# Patient Record
Sex: Female | Born: 1980 | Race: White | Hispanic: No | Marital: Married | Smoking: Never smoker
Health system: Southern US, Community
[De-identification: ages and names within clinical notes are randomized; demographics above are authoritative.]

## PROBLEM LIST (undated history)

## (undated) DIAGNOSIS — I10 Essential (primary) hypertension: Secondary | ICD-10-CM

## (undated) DIAGNOSIS — D649 Anemia, unspecified: Secondary | ICD-10-CM

## (undated) DIAGNOSIS — E785 Hyperlipidemia, unspecified: Secondary | ICD-10-CM

## (undated) DIAGNOSIS — J449 Chronic obstructive pulmonary disease, unspecified: Secondary | ICD-10-CM

## (undated) DIAGNOSIS — R87619 Unspecified abnormal cytological findings in specimens from cervix uteri: Secondary | ICD-10-CM

## (undated) DIAGNOSIS — F32A Depression, unspecified: Secondary | ICD-10-CM

## (undated) DIAGNOSIS — F419 Anxiety disorder, unspecified: Secondary | ICD-10-CM

## (undated) HISTORY — DX: Anemia, unspecified: D64.9

## (undated) HISTORY — PX: CRYOTHERAPY: SHX1416

## (undated) HISTORY — DX: Anxiety disorder, unspecified: F41.9

## (undated) HISTORY — DX: Chronic obstructive pulmonary disease, unspecified: J44.9

## (undated) HISTORY — DX: Essential (primary) hypertension: I10

## (undated) HISTORY — DX: Hyperlipidemia, unspecified: E78.5

## (undated) HISTORY — DX: Depression, unspecified: F32.A

## (undated) HISTORY — DX: Unspecified abnormal cytological findings in specimens from cervix uteri: R87.619

---

## 2002-02-20 HISTORY — PX: LAPAROSCOPY: SHX197

## 2004-08-09 ENCOUNTER — Other Ambulatory Visit: Admission: RE | Admit: 2004-08-09 | Discharge: 2004-08-09 | Payer: Self-pay | Admitting: Obstetrics & Gynecology

## 2007-05-14 ENCOUNTER — Encounter: Admission: RE | Admit: 2007-05-14 | Discharge: 2007-05-14 | Payer: Self-pay | Admitting: Obstetrics and Gynecology

## 2007-06-27 ENCOUNTER — Inpatient Hospital Stay (HOSPITAL_COMMUNITY): Admission: AD | Admit: 2007-06-27 | Discharge: 2007-06-27 | Payer: Self-pay | Admitting: Obstetrics & Gynecology

## 2007-06-27 ENCOUNTER — Inpatient Hospital Stay (HOSPITAL_COMMUNITY): Admission: AD | Admit: 2007-06-27 | Discharge: 2007-06-29 | Payer: Self-pay | Admitting: Obstetrics and Gynecology

## 2008-02-21 DIAGNOSIS — R87619 Unspecified abnormal cytological findings in specimens from cervix uteri: Secondary | ICD-10-CM

## 2008-02-21 HISTORY — DX: Unspecified abnormal cytological findings in specimens from cervix uteri: R87.619

## 2009-10-24 ENCOUNTER — Inpatient Hospital Stay (HOSPITAL_COMMUNITY): Admission: AD | Admit: 2009-10-24 | Discharge: 2009-10-25 | Payer: Self-pay | Admitting: Obstetrics & Gynecology

## 2010-01-27 ENCOUNTER — Inpatient Hospital Stay (HOSPITAL_COMMUNITY): Admission: AD | Admit: 2010-01-27 | Discharge: 2009-10-27 | Payer: Self-pay | Admitting: Obstetrics & Gynecology

## 2010-05-05 LAB — CBC
HCT: 29.2 % — ABNORMAL LOW (ref 36.0–46.0)
HCT: 40.8 % (ref 36.0–46.0)
Hemoglobin: 10.1 g/dL — ABNORMAL LOW (ref 12.0–15.0)
Hemoglobin: 14 g/dL (ref 12.0–15.0)
MCH: 32 pg (ref 26.0–34.0)
MCHC: 34.6 g/dL (ref 30.0–36.0)
MCV: 91.4 fL (ref 78.0–100.0)
MCV: 92.4 fL (ref 78.0–100.0)
RBC: 3.16 MIL/uL — ABNORMAL LOW (ref 3.87–5.11)
WBC: 11.5 10*3/uL — ABNORMAL HIGH (ref 4.0–10.5)

## 2010-05-05 LAB — DIFFERENTIAL
Eosinophils Relative: 0 % (ref 0–5)
Lymphocytes Relative: 13 % (ref 12–46)
Lymphs Abs: 1.5 10*3/uL (ref 0.7–4.0)
Monocytes Absolute: 1.2 10*3/uL — ABNORMAL HIGH (ref 0.1–1.0)
Monocytes Relative: 10 % (ref 3–12)
Neutro Abs: 8.7 10*3/uL — ABNORMAL HIGH (ref 1.7–7.7)

## 2010-07-05 NOTE — Discharge Summary (Signed)
Monica Fry, Monica Fry              ACCOUNT NO.:  [id number]   MEDICAL RECORD NO.:  [id number]          PATIENT TYPE:  INP   LOCATION:  9132                          FACILITY:  [facility]   PHYSICIAN:  Gerrit Friends. Aldona Bar, M.D.   DATE OF BIRTH:  September 03, 1980   DATE OF ADMISSION:  06/27/2007  DATE OF DISCHARGE:  06/29/2007                               DISCHARGE SUMMARY   DISCHARGE DIAGNOSES:  1. Term pregnancy, delivered 7-pound 11-ounce female infant, Apgars 9      and 9.  2. Blood type O positive.   SUMMARY:  This 30 year old primigravida was admitted at 38-39 weeks in  labor.  Her pregnancy was relatively benign.  She progressed and after  pushing for 2-1/2 hours with good effort, was assisted with vacuum and  delivered a viable female infant weighing 7 pounds 11 ounces with good  Apgars over a second-degree episiotomy.  Episiotomy was repaired without  difficulty and actually turned into a slight third-degree tear.  Postpartum course was benign.  Mother did have some hypertension and was  treated postpartum with p.o. labetalol, 200 mg twice daily, with good  normotensive values noted even at the time of discharge.  On the morning  of Jun 25, 2007, she had a hemoglobin of 11.9, a white count of 13,600,  and a platelet count of 106,000 (was 90,000 on Jun 28, 2007).  Her  comprehensive metabolic profile on Jun 25, 2007, was within normal limits  as well.   On the morning of Jun 25, 2007, she was given all appropriate  instructions and understood all instructions well.   DISCHARGE MEDICATIONS:  Include Advil and she will use 2-3 every 4-5  hours as needed for pain.  She will be continued on her vitamins.  She  will continue on labetalol 200 mg twice daily for another 10 days and  will be checked in the office in approximately 8-9 days, and a decision  will be made at that time on discontinuance of her labetalol.   She was given all the appropriate instructions for discharge brochure  and  understood all instructions well.   CONDITION ON DISCHARGE:  Improved.      Gerrit Friends. Aldona Bar, M.D.  Electronically Signed     RMW/MEDQ  D:  06/29/2007  T:  06/29/2007  Job:  [id number]

## 2010-12-06 ENCOUNTER — Encounter: Payer: Self-pay | Admitting: Obstetrics & Gynecology

## 2010-12-06 ENCOUNTER — Ambulatory Visit (INDEPENDENT_AMBULATORY_CARE_PROVIDER_SITE_OTHER): Payer: BC Managed Care – PPO | Admitting: Obstetrics & Gynecology

## 2010-12-06 VITALS — BP 117/75 | HR 94 | Temp 98.5°F | Resp 16 | Ht 62.0 in | Wt 132.0 lb

## 2010-12-06 DIAGNOSIS — Z01419 Encounter for gynecological examination (general) (routine) without abnormal findings: Secondary | ICD-10-CM

## 2010-12-06 DIAGNOSIS — Z23 Encounter for immunization: Secondary | ICD-10-CM

## 2010-12-06 DIAGNOSIS — Z1272 Encounter for screening for malignant neoplasm of vagina: Secondary | ICD-10-CM

## 2010-12-06 DIAGNOSIS — N949 Unspecified condition associated with female genital organs and menstrual cycle: Secondary | ICD-10-CM

## 2010-12-06 DIAGNOSIS — Z0000 Encounter for general adult medical examination without abnormal findings: Secondary | ICD-10-CM

## 2010-12-06 DIAGNOSIS — G8929 Other chronic pain: Secondary | ICD-10-CM

## 2010-12-06 DIAGNOSIS — Z113 Encounter for screening for infections with a predominantly sexual mode of transmission: Secondary | ICD-10-CM

## 2010-12-06 MED ORDER — NORETHINDRONE-ETH ESTRADIOL 1-35 MG-MCG PO TABS: ORAL_TABLET | ORAL | Status: DC

## 2010-12-06 NOTE — Progress Notes (Signed)
Subjective:    Patient ID: Monica Fry, female    DOB: 02/22/1980, 30 y.o.   MRN: [id number]  HPI    Review of Systems     Objective:   Physical Exam        Assessment & Plan:   Subjective:    Monica Fry is a 30 y.o. female who presents for an annual exam. The patient has no complaints today except that her chronic pelvic pain has returned, and she would like to restart her OCPs in continuous fashion. The patient is sexually active. GYN screening history: last pap: was abnormal: ?LGSIL. The patient wears seatbelts: yes. The patient participates in regular exercise: yes. Has the patient ever been transfused or tattooed?: yes.(tattoo) The patient reports that there is not domestic violence in her life.   Menstrual History: OB History    Grav Para Term Preterm Abortions TAB SAB Ect Mult Living   2 2 2       2       Menarche age: 9 Patient's last menstrual period was 11/30/2010.    The following portions of the patient's history were reviewed and updated as appropriate: allergies, current medications, past family history, past medical history, past social history, past surgical history and problem list.  Review of Systems A comprehensive review of systems was negative.  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   Objective:    BP 117/75  Pulse 94  Temp(Src) 98.5 F (36.9 C) (Oral)  Resp 16  Ht 5\' 2"  (1.575 m)  Wt 132 lb (59.875 kg)  BMI 24.14 kg/m2  LMP 11/30/2010  Breastfeeding? Unknown  General Appearance:    Alert, cooperative, no distress, appears stated age  Head:    Normocephalic, without obvious abnormality, atraumatic  Eyes:    PERRL, conjunctiva/corneas clear, EOM's intact, fundi    benign, both eyes  Ears:    Normal TM's and external ear canals, both ears  Nose:   Nares normal, septum midline, mucosa normal, no drainage    or sinus tenderness  Throat:   Lips, mucosa, and tongue normal; teeth and gums normal  Neck:   Supple, symmetrical,  trachea midline, no adenopathy;    thyroid:  no enlargement/tenderness/nodules; no carotid   bruit or JVD  Back:     Symmetric, no curvature, ROM normal, no CVA tenderness  Lungs:     Clear to auscultation bilaterally, respirations unlabored  Chest Wall:    No tenderness or deformity   Heart:    Regular rate and rhythm, S1 and S2 normal, no murmur, rub   or gallop  Breast Exam:    No tenderness, masses, or nipple abnormality  Abdomen:     Soft, non-tender, bowel sounds active all four quadrants,    no masses, no organomegaly  Genitalia:    Normal female without lesion, discharge or tenderness, cervix shows evidence of a LEEP, NSSRV, mobile ,NT non-enlarged and NT adnexa  Rectal:     Extremities:   Extremities normal, atraumatic, no cyanosis or edema  Pulses:   2+ and symmetric all extremities  Skin:   Skin color, texture, turgor normal, no rashes or lesions  Lymph nodes:   Cervical, supraclavicular, and axillary nodes normal  Neurologic:   CNII-XII intact, normal strength, sensation and reflexes    throughout  .    Assessment:    Healthy female exam.   GSUI Plan:     Await pap smear results.  Rec SBE/SVE Information of TVT given Flu  shot today

## 2011-11-29 ENCOUNTER — Other Ambulatory Visit: Payer: Self-pay | Admitting: Obstetrics & Gynecology

## 2011-12-01 ENCOUNTER — Other Ambulatory Visit: Payer: Self-pay | Admitting: *Deleted

## 2011-12-01 DIAGNOSIS — Z309 Encounter for contraceptive management, unspecified: Secondary | ICD-10-CM

## 2011-12-01 MED ORDER — NORETHINDRONE-ETH ESTRADIOL 1-35 MG-MCG PO TABS: ORAL_TABLET | ORAL | Status: DC

## 2011-12-01 NOTE — Telephone Encounter (Signed)
RF granted for OCP's as pt has appt in 2 weeks.

## 2011-12-13 ENCOUNTER — Encounter: Payer: Self-pay | Admitting: Obstetrics & Gynecology

## 2011-12-13 ENCOUNTER — Ambulatory Visit (INDEPENDENT_AMBULATORY_CARE_PROVIDER_SITE_OTHER): Payer: BC Managed Care – PPO | Admitting: Obstetrics & Gynecology

## 2011-12-13 VITALS — BP 124/81 | HR 102 | Temp 97.6°F | Resp 16 | Ht 62.0 in | Wt 134.0 lb

## 2011-12-13 DIAGNOSIS — Z23 Encounter for immunization: Secondary | ICD-10-CM

## 2011-12-13 DIAGNOSIS — Z01419 Encounter for gynecological examination (general) (routine) without abnormal findings: Secondary | ICD-10-CM

## 2011-12-13 DIAGNOSIS — Z124 Encounter for screening for malignant neoplasm of cervix: Secondary | ICD-10-CM

## 2011-12-13 DIAGNOSIS — Z1151 Encounter for screening for human papillomavirus (HPV): Secondary | ICD-10-CM

## 2011-12-13 DIAGNOSIS — Z309 Encounter for contraceptive management, unspecified: Secondary | ICD-10-CM

## 2011-12-13 MED ORDER — NORETHINDRONE-ETH ESTRADIOL 1-35 MG-MCG PO TABS: ORAL_TABLET | ORAL | Status: DC

## 2011-12-13 MED ORDER — INFLUENZA VIRUS VACC SPLIT PF IM SUSP: 0.5000 mL | Freq: Once | INTRAMUSCULAR | Status: DC

## 2011-12-13 NOTE — Progress Notes (Signed)
Subjective:    Monica Fry is a 31 y.o. female who presents for an annual exam. The patient has no complaints today. The patient is sexually active. GYN screening history: last pap: was normal. The patient wears seatbelts: yes. The patient participates in regular exercise: yes. Has the patient ever been transfused or tattooed?: yes. The patient reports that there is not domestic violence in her life.   Menstrual History: OB History    Grav Para Term Preterm Abortions TAB SAB Ect Mult Living   2 2 2       2       Menarche age: 43 Patient's last menstrual period was 11/29/2011.    The following portions of the patient's history were reviewed and updated as appropriate: allergies, current medications, past family history, past medical history, past social history, past surgical history and problem list.  Review of Systems A comprehensive review of systems was negative. 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   Objective:    BP 124/81  Pulse 102  Temp 97.6 F (36.4 C) (Oral)  Resp 16  Ht 5\' 2"  (1.575 m)  Wt 134 lb (60.782 kg)  BMI 24.51 kg/m2  LMP 11/29/2011  Breastfeeding? No  General Appearance:    Alert, cooperative, no distress, appears stated age  Head:    Normocephalic, without obvious abnormality, atraumatic  Eyes:    PERRL, conjunctiva/corneas clear, EOM's intact, fundi    benign, both eyes  Ears:    Normal TM's and external ear canals, both ears  Nose:   Nares normal, septum midline, mucosa normal, no drainage    or sinus tenderness  Throat:   Lips, mucosa, and tongue normal; teeth and gums normal  Neck:   Supple, symmetrical, trachea midline, no adenopathy;    thyroid:  no enlargement/tenderness/nodules; no carotid   bruit or JVD  Back:     Symmetric, no curvature, ROM normal, no CVA tenderness  Lungs:     Clear to auscultation bilaterally, respirations unlabored  Chest Wall:    No tenderness or deformity   Heart:    Regular rate and rhythm, S1 and S2 normal, no  murmur, rub   or gallop  Breast Exam:    No tenderness, masses, or nipple abnormality  Abdomen:     Soft, non-tender, bowel sounds active all four quadrants,    no masses, no organomegaly  Genitalia:    Normal female without lesion, discharge or tenderness, NSSR, NT, minimal mobility, adnexa normal     Extremities:   Extremities normal, atraumatic, no cyanosis or edema  Pulses:   2+ and symmetric all extremities  Skin:   Skin color, texture, turgor normal, no rashes or lesions  Lymph nodes:   Cervical, supraclavicular, and axillary nodes normal  Neurologic:   CNII-XII intact, normal strength, sensation and reflexes    throughout  .    Assessment:    Healthy female exam.    Plan:     Thin prep Pap smear.  Flu vaccine today

## 2011-12-19 ENCOUNTER — Encounter: Payer: Self-pay | Admitting: *Deleted

## 2012-01-26 ENCOUNTER — Other Ambulatory Visit: Payer: Self-pay | Admitting: Obstetrics & Gynecology

## 2012-01-26 DIAGNOSIS — Z309 Encounter for contraceptive management, unspecified: Secondary | ICD-10-CM

## 2012-01-26 MED ORDER — NORETHINDRONE-ETH ESTRADIOL 1-35 MG-MCG PO TABS: ORAL_TABLET | ORAL | Status: DC

## 2012-01-26 NOTE — Telephone Encounter (Signed)
Pharmacy called and stated that they did not have the RF on the OCP prescribed in 10/13.  Authorization given.

## 2012-10-30 ENCOUNTER — Ambulatory Visit (INDEPENDENT_AMBULATORY_CARE_PROVIDER_SITE_OTHER): Payer: BC Managed Care – PPO | Admitting: Physician Assistant

## 2012-10-30 ENCOUNTER — Encounter: Payer: Self-pay | Admitting: Physician Assistant

## 2012-10-30 VITALS — BP 133/93 | HR 82 | Ht 62.0 in | Wt 134.0 lb

## 2012-10-30 DIAGNOSIS — N946 Dysmenorrhea, unspecified: Secondary | ICD-10-CM

## 2012-10-30 DIAGNOSIS — Z23 Encounter for immunization: Secondary | ICD-10-CM

## 2012-10-30 DIAGNOSIS — Z309 Encounter for contraceptive management, unspecified: Secondary | ICD-10-CM

## 2012-10-30 MED ORDER — NORETHINDRONE-ETH ESTRADIOL 1-35 MG-MCG PO TABS: ORAL_TABLET | ORAL | Status: DC

## 2012-10-30 NOTE — Progress Notes (Signed)
  Subjective:    Patient ID: Monica Fry, female    DOB: 1980/08/18, 32 y.o.   MRN: [id number]  HPI Patient is a 32 year old female who presents to the clinic to establish care today. Past medical history is positive for dysmenorrhea but well controlled with ongoing birth control. She also does take Zyrtec daily for allergies. Patient does not smoke. She does drink alcohol occasionally. She works out 30 minutes at least 5 days a week. Last Pap smear was in 2013 and with Dr. Marice Potter in our office. Patient does have a family history of breast cancer, heart attack, depression, diabetes, high cholesterol and high blood pressure. She has not had fasting labs or complete physical done and many years.  She has no ongoing complaints today. She does needed one refill on her birth control to make it to her OB appointment next month.   Review of Systems  All other systems reviewed and are negative.       Objective:   Physical Exam  Constitutional: She is oriented to person, place, and time. She appears well-developed and well-nourished.  HENT:  Head: Normocephalic and atraumatic.  Neck: Normal range of motion. Neck supple.  Cardiovascular: Normal rate, regular rhythm and normal heart sounds.   Pulmonary/Chest: Effort normal and breath sounds normal. She has no wheezes.  Neurological: She is alert and oriented to person, place, and time.  Skin: Skin is warm and dry.  Psychiatric: She has a normal mood and affect. Her behavior is normal.          Assessment & Plan:  Contraception/dysmenorrhea-Dix refilled birth control for one month until she sees OB.  Patient is aware that she does need a complete physical with fasting labs. She will make appointment. Flu shot was given today without any complications.

## 2012-11-08 ENCOUNTER — Ambulatory Visit (INDEPENDENT_AMBULATORY_CARE_PROVIDER_SITE_OTHER): Payer: BC Managed Care – PPO | Admitting: Physician Assistant

## 2012-11-08 ENCOUNTER — Encounter: Payer: Self-pay | Admitting: Physician Assistant

## 2012-11-08 VITALS — BP 129/91 | HR 82 | Wt 133.0 lb

## 2012-11-08 DIAGNOSIS — Z13220 Encounter for screening for lipoid disorders: Secondary | ICD-10-CM

## 2012-11-08 DIAGNOSIS — Z309 Encounter for contraceptive management, unspecified: Secondary | ICD-10-CM

## 2012-11-08 DIAGNOSIS — J302 Other seasonal allergic rhinitis: Secondary | ICD-10-CM | POA: Insufficient documentation

## 2012-11-08 DIAGNOSIS — Z131 Encounter for screening for diabetes mellitus: Secondary | ICD-10-CM

## 2012-11-08 DIAGNOSIS — J309 Allergic rhinitis, unspecified: Secondary | ICD-10-CM

## 2012-11-08 DIAGNOSIS — Z0000 Encounter for general adult medical examination without abnormal findings: Secondary | ICD-10-CM

## 2012-11-08 LAB — LIPID PANEL
Cholesterol: 188 mg/dL (ref 0–200)
LDL Cholesterol: 104 mg/dL — ABNORMAL HIGH (ref 0–99)
Total CHOL/HDL Ratio: 3.4 Ratio
VLDL: 29 mg/dL (ref 0–40)

## 2012-11-08 LAB — COMPLETE METABOLIC PANEL WITH GFR
ALT: 13 U/L (ref 0–35)
AST: 16 U/L (ref 0–37)
Albumin: 4.4 g/dL (ref 3.5–5.2)
Alkaline Phosphatase: 42 U/L (ref 39–117)
BUN: 12 mg/dL (ref 6–23)
Potassium: 4.2 mEq/L (ref 3.5–5.3)
Sodium: 139 mEq/L (ref 135–145)

## 2012-11-08 MED ORDER — FLUTICASONE PROPIONATE 50 MCG/ACT NA SUSP: 2.0000 | Freq: Every day | NASAL | Status: DC

## 2012-11-08 MED ORDER — NORETHINDRONE-ETH ESTRADIOL 1-35 MG-MCG PO TABS: ORAL_TABLET | ORAL | Status: DC

## 2012-11-08 NOTE — Patient Instructions (Signed)
Keeping You Healthy  Get These Tests 1. Blood Pressure- Have your blood pressure checked once a year by your health care provider.  Normal blood pressure is 120/80. 2. Weight- Have your body mass index (BMI) calculated to screen for obesity.  BMI is measure of body fat based on height and weight.  You can also calculate your own BMI at www.nhlbisupport.com/bmi/. 3. Cholesterol- Have your cholesterol checked every 5 years starting at age 33 then yearly starting at age 45. 4. Chlamydia, HIV, and other sexually transmitted diseases- Get screened every year until age 25, then within three months of each new sexual provider. 5. Pap Smear- Every 1-3 years; discuss with your health care provider. 6. Mammogram- Every year starting at age 40  Take these medicines  Calcium with Vitamin D-Your body needs 1200 mg of Calcium each day and 800-1000 IU of Vitamin D daily.  Your body can only absorb 500 mg of Calcium at a time so Calcium must be taken in 2 or 3 divided doses throughout the day.  Multivitamin with folic acid- Once daily if it is possible for you to become pregnant.  Get these Immunizations  Tetanus shot- Every 10 years.  Flu shot-Every year.  Take these steps 1. Do not smoke-Your healthcare provider can help you quit.  For tips on how to quit go to www.smokefree.gov or call 1-800 QUITNOW. 2. Be physically active- Exercise 5 days a week for at least 30 minutes.  If you are not already physically active, start slow and gradually work up to 30 minutes of moderate physical activity.  Examples of moderate activity include walking briskly, dancing, swimming, bicycling, etc. 3. Breast Cancer- A self breast exam every month is important for early detection of breast cancer.  For more information and instruction on self breast exams, ask your healthcare provider or www.womenshealth.gov/faq/breast-self-exam.cfm. 4. Eat a healthy diet- Eat a variety of healthy foods such as fruits, vegetables, whole  grains, low fat milk, low fat cheeses, yogurt, lean meats, poultry and fish, beans, nuts, tofu, etc.  For more information go to www. Thenutritionsource.org 5. Drink alcohol in moderation- Limit alcohol intake to one drink or less per day. Never drink and drive. 6. Depression- Your emotional health is as important as your physical health.  If you're feeling down or losing interest in things you normally enjoy please talk to your healthcare provider about being screened for depression. 7. Dental visit- Brush and floss your teeth twice daily; visit your dentist twice a year. 8. Eye doctor- Get an eye exam at least every 2 years. 9. Helmet use- Always wear a helmet when riding a bicycle, motorcycle, rollerblading or skateboarding. 10. Safe sex- If you may be exposed to sexually transmitted infections, use a condom. 11. Seat belts- Seat belts can save your live; always wear one. 12. Smoke/Carbon Monoxide detectors- These detectors need to be installed on the appropriate level of your home. Replace batteries at least once a year. 13. Skin cancer- When out in the sun please cover up and use sunscreen 15 SPF or higher. 14. Violence- If anyone is threatening or hurting you, please tell your healthcare provider.        

## 2012-11-08 NOTE — Progress Notes (Signed)
  Subjective:     Monica Fry is a 32 y.o. female and is here for a comprehensive physical exam. The patient reports problems - she has ongoing problems with allergies and nasal congestion. Takes zyrtec daily. Marland Kitchen  History   Social History  . Marital Status: Married    Spouse Name: N/A    Number of Children: N/A  . Years of Education: N/A   Occupational History  . pharmacist    Social History Main Topics  . Smoking status: Never Smoker   . Smokeless tobacco: Never Used  . Alcohol Use: Yes     Comment: very socially  . Drug Use: No  . Sexual Activity: Yes    Partners: Male    Birth Control/ Protection: Pill   Other Topics Concern  . Not on file   Social History Narrative  . No narrative on file   Health Maintenance  Topic Date Due  . Influenza Vaccine  09/20/2013  . Pap Smear  12/13/2014  . Tetanus/tdap  10/31/2022    The following portions of the patient's history were reviewed and updated as appropriate: allergies, current medications, past family history, past medical history, past social history, past surgical history and problem list.  Review of Systems A comprehensive review of systems was negative.   Objective:    BP 129/91  Pulse 82  Wt 133 lb (60.328 kg)  BMI 24.32 kg/m2  LMP 10/29/2012 General appearance: alert, cooperative and appears stated age Head: Normocephalic, without obvious abnormality, atraumatic Eyes: conjunctivae/corneas clear. PERRL, EOM's intact. Fundi benign. Ears: normal TM's and external ear canals both ears Nose: Nares normal. Septum midline. Mucosa normal. No drainage or sinus tenderness. Throat: lips, mucosa, and tongue normal; teeth and gums normal Neck: no adenopathy, no carotid bruit, no JVD, supple, symmetrical, trachea midline and thyroid not enlarged, symmetric, no tenderness/mass/nodules Back: symmetric, no curvature. ROM normal. No CVA tenderness. Lungs: clear to auscultation bilaterally Heart: regular rate and rhythm,  S1, S2 normal, no murmur, click, rub or gallop Abdomen: soft, non-tender; bowel sounds normal; no masses,  no organomegaly Extremities: extremities normal, atraumatic, no cyanosis or edema Pulses: 2+ and symmetric Skin: Skin color, texture, turgor normal. No rashes or lesions Lymph nodes: Cervical, supraclavicular, and axillary nodes normal. Neurologic: Grossly normal    Assessment:    Healthy female exam.      Plan:    CPE- Vaccines up to date. Screening labs ordered. Flu shot gotten this year. Discussed importance of calcium and vitamin d. Continue exercising daily. Sent OCP for one more month until can see OB/GYN.   Seasonal allergies/allergic rhinitis- Gave flonase to use as needed in combination with zyrtec. Discussed trying beta glucan for allergies daily and see if helps with symptoms.  See After Visit Summary for Counseling Recommendations

## 2012-12-10 ENCOUNTER — Other Ambulatory Visit: Payer: Self-pay | Admitting: *Deleted

## 2012-12-10 DIAGNOSIS — Z309 Encounter for contraceptive management, unspecified: Secondary | ICD-10-CM

## 2012-12-10 MED ORDER — NORETHINDRONE-ETH ESTRADIOL 1-35 MG-MCG PO TABS: ORAL_TABLET | ORAL | Status: DC

## 2012-12-10 NOTE — Telephone Encounter (Signed)
RF times 1 for OCP's until her appt with Dr Marice Potter 12/17/12

## 2012-12-17 ENCOUNTER — Ambulatory Visit (INDEPENDENT_AMBULATORY_CARE_PROVIDER_SITE_OTHER): Payer: Commercial Managed Care - PPO | Admitting: Obstetrics & Gynecology

## 2012-12-17 ENCOUNTER — Encounter: Payer: Self-pay | Admitting: Obstetrics & Gynecology

## 2012-12-17 VITALS — BP 135/95 | HR 75 | Resp 16 | Ht 64.0 in | Wt 135.0 lb

## 2012-12-17 DIAGNOSIS — Z124 Encounter for screening for malignant neoplasm of cervix: Secondary | ICD-10-CM

## 2012-12-17 DIAGNOSIS — Z0000 Encounter for general adult medical examination without abnormal findings: Secondary | ICD-10-CM

## 2012-12-17 DIAGNOSIS — Z309 Encounter for contraceptive management, unspecified: Secondary | ICD-10-CM

## 2012-12-17 DIAGNOSIS — Z1151 Encounter for screening for human papillomavirus (HPV): Secondary | ICD-10-CM

## 2012-12-17 DIAGNOSIS — Z01419 Encounter for gynecological examination (general) (routine) without abnormal findings: Secondary | ICD-10-CM

## 2012-12-17 MED ORDER — NORETHINDRONE-ETH ESTRADIOL 1-35 MG-MCG PO TABS: ORAL_TABLET | ORAL | Status: DC

## 2012-12-17 NOTE — Progress Notes (Signed)
Subjective:    Monica Fry is a 32 y.o. female who presents for an annual exam. The patient has no complaints today. She would like a refill of her OCPs which she takes continueously. She will take her BP at work and agrees to change to a different contraceptive method is her BP stays elevated.  The patient is sexually active. GYN screening history: last pap: was normal. The patient wears seatbelts: yes. The patient participates in regular exercise: yes. Has the patient ever been transfused or tattooed?: yes. The patient reports that there is not domestic violence in her life.   Menstrual History: OB History   Grav Para Term Preterm Abortions TAB SAB Ect Mult Living   2 2 2       2       Menarche age: 83  No LMP recorded. Patient is not currently having periods (Reason: Oral contraceptives).    The following portions of the patient's history were reviewed and updated as appropriate: allergies, current medications, past family history, past medical history, past social history, past surgical history and problem list.  Review of Systems A comprehensive review of systems was negative.    Objective:    BP 135/95  Pulse 75  Resp 16  Ht 5\' 4"  (1.626 m)  Wt 135 lb (61.236 kg)  BMI 23.16 kg/m2  General Appearance:    Alert, cooperative, no distress, appears stated age  Head:    Normocephalic, without obvious abnormality, atraumatic  Eyes:    PERRL, conjunctiva/corneas clear, EOM's intact, fundi    benign, both eyes  Ears:    Normal TM's and external ear canals, both ears  Nose:   Nares normal, septum midline, mucosa normal, no drainage    or sinus tenderness  Throat:   Lips, mucosa, and tongue normal; teeth and gums normal  Neck:   Supple, symmetrical, trachea midline, no adenopathy;    thyroid:  no enlargement/tenderness/nodules; no carotid   bruit or JVD  Back:     Symmetric, no curvature, ROM normal, no CVA tenderness  Lungs:     Clear to auscultation bilaterally, respirations  unlabored  Chest Wall:    No tenderness or deformity   Heart:    Regular rate and rhythm, S1 and S2 normal, no murmur, rub   or gallop  Breast Exam:    No tenderness, masses, or nipple abnormality  Abdomen:     Soft, non-tender, bowel sounds active all four quadrants,    no masses, no organomegaly  Genitalia:    Normal female without lesion, discharge or tenderness, NSSA, NT, mobile, normal adnexal exam     Extremities:   Extremities normal, atraumatic, no cyanosis or edema  Pulses:   2+ and symmetric all extremities  Skin:   Skin color, texture, turgor normal, no rashes or lesions  Lymph nodes:   Cervical, supraclavicular, and axillary nodes normal  Neurologic:   CNII-XII intact, normal strength, sensation and reflexes    throughout  .    Assessment:    Healthy female exam.    Plan:     Breast self exam technique reviewed and patient encouraged to perform self-exam monthly. Thin prep Pap smear. with cotesting

## 2012-12-17 NOTE — Patient Instructions (Signed)

## 2013-04-28 ENCOUNTER — Ambulatory Visit (INDEPENDENT_AMBULATORY_CARE_PROVIDER_SITE_OTHER): Payer: Commercial Managed Care - PPO

## 2013-04-28 ENCOUNTER — Ambulatory Visit (INDEPENDENT_AMBULATORY_CARE_PROVIDER_SITE_OTHER): Payer: Commercial Managed Care - PPO | Admitting: Sports Medicine

## 2013-04-28 ENCOUNTER — Encounter: Payer: Self-pay | Admitting: Sports Medicine

## 2013-04-28 VITALS — BP 128/85 | HR 100 | Ht 62.0 in | Wt 137.0 lb

## 2013-04-28 DIAGNOSIS — W108XXA Fall (on) (from) other stairs and steps, initial encounter: Secondary | ICD-10-CM

## 2013-04-28 DIAGNOSIS — S92309A Fracture of unspecified metatarsal bone(s), unspecified foot, initial encounter for closed fracture: Secondary | ICD-10-CM

## 2013-04-28 DIAGNOSIS — S92309B Fracture of unspecified metatarsal bone(s), unspecified foot, initial encounter for open fracture: Secondary | ICD-10-CM

## 2013-04-28 DIAGNOSIS — M79672 Pain in left foot: Secondary | ICD-10-CM

## 2013-04-28 DIAGNOSIS — S92352A Displaced fracture of fifth metatarsal bone, left foot, initial encounter for closed fracture: Secondary | ICD-10-CM | POA: Insufficient documentation

## 2013-04-28 NOTE — Progress Notes (Signed)
   Subjective:    I'm seeing this patient as a consultation for:  Dr. Linford ArnoldMetheney  CC: Left foot injury  HPI: This is a very pleasant 33 year old female, yesterday she fell down the stairs, and impacted the lateral aspect of her foot against the stairs. She had immediate pain and difficulty bearing weight. Now she has bruising and swelling over the with tenderness directly over the fifth metatarsal shaft, moderate, persistent. She did have an old postop shoe and has been wearing this.  Past medical history, Surgical history, Family history not pertinant except as noted below, Social history, Allergies, and medications have been entered into the medical record, reviewed, and no changes needed.   Review of Systems: No headache, visual changes, nausea, vomiting, diarrhea, constipation, dizziness, abdominal pain, skin rash, fevers, chills, night sweats, weight loss, swollen lymph nodes, body aches, joint swelling, muscle aches, chest pain, shortness of breath, mood changes, visual or auditory hallucinations.   Objective:   General: Well Developed, well nourished, and in no acute distress.  Neuro/Psych: Alert and oriented x3, extra-ocular muscles intact, able to move all 4 extremities, sensation grossly intact. Skin: Warm and dry, no rashes noted.  Respiratory: Not using accessory muscles, speaking in full sentences, trachea midline.  Cardiovascular: Pulses palpable, no extremity edema. Abdomen: Does not appear distended. Left Foot: Bruising swollen Range of motion is full in all directions. Strength is 5/5 in all directions. No hallux valgus. No pes cavus or pes planus. No abnormal callus noted. No pain over the navicular prominence, or base of fifth metatarsal. No tenderness to palpation of the calcaneal insertion of plantar fascia. No pain at the Achilles insertion. No pain over the calcaneal bursa. No pain of the retrocalcaneal bursa. Discrete tenderness to palpation with palpable  deformity of the shaft of the fifth metatarsal. No hallux rigidus or limitus. No tenderness palpation over interphalangeal joints. No pain with compression of the metatarsal heads. Neurovascularly intact distally.  X-rays were personally reviewed a show a spiral fracture to the fifth metatarsal shaft.  Foot was strapped with compressive dressing.  Impression and Recommendations:   This case required medical decision making of moderate complexity.

## 2013-04-28 NOTE — Assessment & Plan Note (Signed)
Monica Fry, no pain medication desired. Strap with compressive dressing. Return to see me in 2 weeks, x-ray before visit.  I billed a fracture code for this visit, all subsequent visits for this complaint will be "post-op checks" in the global period.

## 2013-05-12 ENCOUNTER — Ambulatory Visit (INDEPENDENT_AMBULATORY_CARE_PROVIDER_SITE_OTHER): Payer: Commercial Managed Care - PPO

## 2013-05-12 ENCOUNTER — Ambulatory Visit (INDEPENDENT_AMBULATORY_CARE_PROVIDER_SITE_OTHER): Payer: Commercial Managed Care - PPO | Admitting: Sports Medicine

## 2013-05-12 ENCOUNTER — Encounter: Payer: Self-pay | Admitting: Sports Medicine

## 2013-05-12 VITALS — BP 113/78 | HR 91 | Wt 141.0 lb

## 2013-05-12 DIAGNOSIS — S92309A Fracture of unspecified metatarsal bone(s), unspecified foot, initial encounter for closed fracture: Secondary | ICD-10-CM

## 2013-05-12 DIAGNOSIS — M79672 Pain in left foot: Secondary | ICD-10-CM

## 2013-05-12 DIAGNOSIS — S92352A Displaced fracture of fifth metatarsal bone, left foot, initial encounter for closed fracture: Secondary | ICD-10-CM

## 2013-05-12 DIAGNOSIS — IMO0001 Reserved for inherently not codable concepts without codable children: Secondary | ICD-10-CM

## 2013-05-12 NOTE — Progress Notes (Signed)
  Subjective: 2 weeks post left fifth metatarsal spiral fracture, delivery well [facility], she is having a little pain due to the required leg length discrepancy.   Objective: General: Well-developed, well-nourished, and in no acute distress. Left foot: Bruised, swollen, and still slightly tender to palpation over the fracture.  X-rays were reviewed do show anatomic alignment of the spiral fracture without any bony callus formation yet. He  Assessment/plan:

## 2013-05-12 NOTE — Assessment & Plan Note (Signed)
Doing very well, return in 2 weeks, actually before visit. Heel lift placed in right shoe.

## 2013-05-27 ENCOUNTER — Ambulatory Visit (INDEPENDENT_AMBULATORY_CARE_PROVIDER_SITE_OTHER): Payer: Commercial Managed Care - PPO | Admitting: Sports Medicine

## 2013-05-27 ENCOUNTER — Encounter: Payer: Self-pay | Admitting: Sports Medicine

## 2013-05-27 ENCOUNTER — Ambulatory Visit (INDEPENDENT_AMBULATORY_CARE_PROVIDER_SITE_OTHER): Payer: Commercial Managed Care - PPO

## 2013-05-27 VITALS — BP 123/86 | HR 96 | Ht 62.0 in | Wt 138.0 lb

## 2013-05-27 DIAGNOSIS — S92352A Displaced fracture of fifth metatarsal bone, left foot, initial encounter for closed fracture: Secondary | ICD-10-CM

## 2013-05-27 DIAGNOSIS — S92309A Fracture of unspecified metatarsal bone(s), unspecified foot, initial encounter for closed fracture: Secondary | ICD-10-CM

## 2013-05-27 NOTE — Progress Notes (Signed)
  Subjective: 4 weeks post left fifth metatarsal shaft fracture, pain-free.   Objective: General: Well-developed, well-nourished, and in no acute distress. Some bruising is persistent, no tenderness over the fracture site.  X-rays reviewed and shows only a trace of bony callus, alignment continues to be anatomic.  Assessment/plan:

## 2013-05-27 NOTE — Assessment & Plan Note (Signed)
Doing extremely well. Continue cast boot for an additional week, then transition into a rigid soled shoe. Return to see me in 2 weeks, x-ray before visit.

## 2013-06-13 ENCOUNTER — Encounter: Payer: Self-pay | Admitting: Sports Medicine

## 2013-06-13 ENCOUNTER — Ambulatory Visit (INDEPENDENT_AMBULATORY_CARE_PROVIDER_SITE_OTHER): Payer: Commercial Managed Care - PPO | Admitting: Sports Medicine

## 2013-06-13 ENCOUNTER — Ambulatory Visit (INDEPENDENT_AMBULATORY_CARE_PROVIDER_SITE_OTHER): Payer: Commercial Managed Care - PPO

## 2013-06-13 VITALS — BP 133/81 | HR 90 | Ht 62.0 in | Wt 136.0 lb

## 2013-06-13 DIAGNOSIS — S92352A Displaced fracture of fifth metatarsal bone, left foot, initial encounter for closed fracture: Secondary | ICD-10-CM

## 2013-06-13 DIAGNOSIS — IMO0001 Reserved for inherently not codable concepts without codable children: Secondary | ICD-10-CM

## 2013-06-13 DIAGNOSIS — S92309A Fracture of unspecified metatarsal bone(s), unspecified foot, initial encounter for closed fracture: Secondary | ICD-10-CM

## 2013-06-13 NOTE — Progress Notes (Signed)
  Subjective: 6 weeks post closed fracture of the left fifth metatarsal shaft, completely pain-free, and which were without problem.   Objective: General: Well-developed, well-nourished, and in no acute distress. Left foot: No tenderness to palpation of the fracture site, able to bear weight without pain.  X-rays reviewed, there is not excellent bone callus but the fracture line is less visible, a line in his perfect, no displacement, no angulation.  Assessment/plan:

## 2013-06-13 NOTE — Assessment & Plan Note (Signed)
6 except, as fractures now healed. Return as needed, avoid strenuous activity for the next month, but otherwise no restrictions.

## 2013-11-14 ENCOUNTER — Ambulatory Visit (INDEPENDENT_AMBULATORY_CARE_PROVIDER_SITE_OTHER): Payer: Commercial Managed Care - PPO | Admitting: Physician Assistant

## 2013-11-14 ENCOUNTER — Encounter: Payer: Commercial Managed Care - PPO | Admitting: Physician Assistant

## 2013-11-14 ENCOUNTER — Encounter: Payer: Self-pay | Admitting: Physician Assistant

## 2013-11-14 VITALS — BP 110/73 | HR 88 | Ht 62.0 in | Wt 138.0 lb

## 2013-11-14 DIAGNOSIS — Z23 Encounter for immunization: Secondary | ICD-10-CM | POA: Diagnosis not present

## 2013-11-14 DIAGNOSIS — Z87898 Personal history of other specified conditions: Secondary | ICD-10-CM

## 2013-11-14 DIAGNOSIS — Z0000 Encounter for general adult medical examination without abnormal findings: Secondary | ICD-10-CM | POA: Diagnosis not present

## 2013-11-14 DIAGNOSIS — Z131 Encounter for screening for diabetes mellitus: Secondary | ICD-10-CM

## 2013-11-14 DIAGNOSIS — Z13220 Encounter for screening for lipoid disorders: Secondary | ICD-10-CM

## 2013-11-14 MED ORDER — NORETHINDRONE-ETH ESTRADIOL 1-35 MG-MCG PO TABS: ORAL_TABLET | ORAL | Status: DC

## 2013-11-14 MED ORDER — SCOPOLAMINE 1 MG/3DAYS TD PT72: 1.0000 | MEDICATED_PATCH | TRANSDERMAL | Status: DC

## 2013-11-14 NOTE — Progress Notes (Signed)
  Subjective:     Monica Fry is a 33 y.o. female and is here for a comprehensive physical exam. The patient reports no problems.  History   Social History  . Marital Status: Married    Spouse Name: N/A    Number of Children: N/A  . Years of Education: N/A   Occupational History  . pharmacist    Social History Main Topics  . Smoking status: Never Smoker   . Smokeless tobacco: Never Used  . Alcohol Use: Yes     Comment: very socially  . Drug Use: No  . Sexual Activity: Yes    Partners: Male    Birth Control/ Protection: Pill   Other Topics Concern  . Not on file   Social History Narrative  . No narrative on file   Health Maintenance  Topic Date Due  . Influenza Vaccine  09/21/2014  . Pap Smear  12/18/2015  . Tetanus/tdap  10/31/2022    The following portions of the patient's history were reviewed and updated as appropriate: allergies, current medications, past family history, past medical history, past social history, past surgical history and problem list.  Review of Systems A comprehensive review of systems was negative.   Objective:    BP 110/73  Pulse 88  Ht  (1.575 m)  Wt 138 lb (62.596 kg)  BMI 25.23 kg/m2  LMP 11/08/2013 General appearance: alert, cooperative and appears stated age Head: Normocephalic, without obvious abnormality, atraumatic Eyes: conjunctivae/corneas clear. PERRL, EOM's intact. Fundi benign. Ears: normal TM's and external ear canals both ears Nose: Nares normal. Septum midline. Mucosa normal. No drainage or sinus tenderness. Throat: lips, mucosa, and tongue normal; teeth and gums normal Neck: no adenopathy, no carotid bruit, no JVD, supple, symmetrical, trachea midline and thyroid not enlarged, symmetric, no tenderness/mass/nodules Back: symmetric, no curvature. ROM normal. No CVA tenderness. Lungs: clear to auscultation bilaterally Heart: regular rate and rhythm, S1, S2 normal, no murmur, click, rub or gallop Abdomen:  soft, non-tender; bowel sounds normal; no masses,  no organomegaly Extremities: extremities normal, atraumatic, no cyanosis or edema Pulses: 2+ and symmetric Skin: Skin color, texture, turgor normal. No rashes or lesions Lymph nodes: Cervical, supraclavicular, and axillary nodes normal. Neurologic: Grossly normal    Assessment:    Healthy female exam.      Plan:    CPE- GYN appt November. Tdap given today. Flu shot given today. Fasting labs were ordered. Discussed need for calcium1200mg  and vitamin D 800 units. Encouraged regular exercise. Gave a few refills until GYN appt for OCP. She takes continuously.   Motion sickness- going [place] and would like to have for rides. Printed rx.  See After Visit Summary for Counseling Recommendations

## 2013-11-14 NOTE — Patient Instructions (Signed)

## 2013-11-15 LAB — COMPLETE METABOLIC PANEL WITH GFR
ALT: 10 U/L (ref 0–35)
AST: 15 U/L (ref 0–37)
Albumin: 4.6 g/dL (ref 3.5–5.2)
Alkaline Phosphatase: 34 U/L — ABNORMAL LOW (ref 39–117)
BILIRUBIN TOTAL: 0.5 mg/dL (ref 0.2–1.2)
BUN: 14 mg/dL (ref 6–23)
CALCIUM: 9.5 mg/dL (ref 8.4–10.5)
CHLORIDE: 102 meq/L (ref 96–112)
CO2: 25 meq/L (ref 19–32)
Creat: 0.92 mg/dL (ref 0.50–1.10)
GFR, EST NON AFRICAN AMERICAN: 81 mL/min
GLUCOSE: 77 mg/dL (ref 70–99)
Potassium: 4.4 mEq/L (ref 3.5–5.3)
SODIUM: 138 meq/L (ref 135–145)
TOTAL PROTEIN: 7.2 g/dL (ref 6.0–8.3)

## 2013-11-15 LAB — LIPID PANEL
Cholesterol: 194 mg/dL (ref 0–200)
HDL: 58 mg/dL (ref >39)
LDL Cholesterol: 105 mg/dL — ABNORMAL HIGH (ref 0–99)
TRIGLYCERIDES: 155 mg/dL — AB (ref <150)
Total CHOL/HDL Ratio: 3.3 Ratio
VLDL: 31 mg/dL (ref 0–40)

## 2013-11-24 ENCOUNTER — Encounter: Payer: Commercial Managed Care - PPO | Admitting: Physician Assistant

## 2013-12-22 ENCOUNTER — Encounter: Payer: Self-pay | Admitting: Physician Assistant

## 2013-12-23 ENCOUNTER — Ambulatory Visit: Payer: Commercial Managed Care - PPO | Admitting: Obstetrics & Gynecology

## 2013-12-24 ENCOUNTER — Other Ambulatory Visit: Payer: Self-pay | Admitting: Obstetrics & Gynecology

## 2014-01-08 ENCOUNTER — Encounter: Payer: Self-pay | Admitting: Obstetrics & Gynecology

## 2014-01-08 ENCOUNTER — Ambulatory Visit (INDEPENDENT_AMBULATORY_CARE_PROVIDER_SITE_OTHER): Payer: Commercial Managed Care - PPO | Admitting: Obstetrics & Gynecology

## 2014-01-08 VITALS — BP 143/90 | HR 80 | Resp 16 | Ht 63.0 in | Wt 137.0 lb

## 2014-01-08 DIAGNOSIS — Z1151 Encounter for screening for human papillomavirus (HPV): Secondary | ICD-10-CM

## 2014-01-08 DIAGNOSIS — Z124 Encounter for screening for malignant neoplasm of cervix: Secondary | ICD-10-CM

## 2014-01-08 DIAGNOSIS — Z01419 Encounter for gynecological examination (general) (routine) without abnormal findings: Secondary | ICD-10-CM

## 2014-01-08 DIAGNOSIS — Z3041 Encounter for surveillance of contraceptive pills: Secondary | ICD-10-CM

## 2014-01-08 MED ORDER — NORETHINDRONE-ETH ESTRADIOL 1-35 MG-MCG PO TABS: ORAL_TABLET | ORAL | Status: DC

## 2014-01-08 NOTE — Progress Notes (Signed)
Patient ID: Monica Fry, female   DOB: Jan 24, 1981, 33 y.o.   MRN: [id number]  Chief Complaint  Patient presents with  . Gynecologic Exam    Pt needs OCP refilled for the next year    HPI Monica Fry is a 33 y.o. female.  [id number] No LMP recorded (lmp unknown). Patient is not currently having periods (Reason: Oral contraceptives). Take OCP continuously and rarely has any BTB. Wants to continue despite that her husband had vasectomy due to dysmenorrhea.  HPI  Past Medical History  Diagnosis Date  . Abnormal Pap smear of cervix 2010  . Anemia     Past Surgical History  Procedure Laterality Date  . Cryotherapy    . Laparoscopy  2004    exploratory for endometriosis    Family History  Problem Relation Age of Onset  . Diabetes Father   . Heart disease Father   . Hypertension Father   . Hyperlipidemia Father   . Cancer Paternal Grandmother     breast and lung  . Thrombosis Maternal Grandmother   . Depression Maternal Grandmother   . Hyperlipidemia Maternal Grandmother   . Hypertension Maternal Grandmother   . Heart attack Paternal Grandfather   . Hyperlipidemia Paternal Grandfather   . Hypertension Paternal Grandfather     Social History History  Substance Use Topics  . Smoking status: Never Smoker   . Smokeless tobacco: Never Used  . Alcohol Use: Yes     Comment: very socially    No Known Allergies  Current Outpatient Prescriptions  Medication Sig Dispense Refill  . cetirizine (ZYRTEC) 10 MG tablet Take 10 mg by mouth daily.    . fluticasone (FLONASE) 50 MCG/ACT nasal spray Place 2 sprays into the nose as needed.    . Multiple Vitamin (MULTIVITAMIN) tablet Take 1 tablet by mouth daily.      . norethindrone-ethinyl estradiol 1/35 (ALAYCEN 1/35) tablet 1 active pill continuously 1 Package 18  . pseudoephedrine (SUDAFED) 30 MG tablet Take 30 mg by mouth every 4 (four) hours as needed for congestion.    Marland Kitchen. ibuprofen (ADVIL,MOTRIN) 200 MG tablet Take 200 mg by  mouth.     No current facility-administered medications for this visit.    Review of Systems Review of Systems  Constitutional: Negative.   Respiratory: Negative.   Genitourinary: Negative for vaginal bleeding, vaginal discharge and menstrual problem.    Blood pressure 143/90, pulse 80, resp. rate 16, height 5\' 3"  (1.6 m), weight 137 lb (62.143 kg).  Physical Exam Physical Exam  Constitutional: She is oriented to person, place, and time. She appears well-developed. No distress.  Cardiovascular: Normal rate.   Pulmonary/Chest: Effort normal. No respiratory distress.  Breasts: breasts appear normal, no suspicious masses, no skin or nipple changes or axillary nodes.   Abdominal: Soft. She exhibits no mass. There is no tenderness.  Genitourinary:  Pelvic exam: normal external genitalia, vulva, vagina, cervix, uterus and adnexa, pap done.   Neurological: She is alert and oriented to person, place, and time.  Skin: Skin is warm and dry.  Psychiatric: She has a normal mood and affect. Her behavior is normal.    Data Reviewed Pap test  Assessment    Well woman, on OCP. H/o abnl pap     Plan    Continue OCP, might consider Mirena. May go to pap with HPV cotesting Q3-5 years        ARNOLD,JAMES 01/08/2014, 3:53 PM

## 2014-01-08 NOTE — Patient Instructions (Signed)
Levonorgestrel intrauterine device (IUD) What is this medicine? LEVONORGESTREL IUD (LEE voe nor jes trel) is a contraceptive (birth control) device. The device is placed inside the uterus by a healthcare professional. It is used to prevent pregnancy and can also be used to treat heavy bleeding that occurs during your period. Depending on the device, it can be used for 3 to 5 years. This medicine may be used for other purposes; ask your health care provider or pharmacist if you have questions. COMMON BRAND NAME(S): LILETTA, Mirena, Skyla What should I tell my health care provider before I take this medicine? They need to know if you have any of these conditions: -abnormal Pap smear -cancer of the breast, uterus, or cervix -diabetes -endometritis -genital or pelvic infection now or in the past -have more than one sexual partner or your partner has more than one partner -heart disease -history of an ectopic or tubal pregnancy -immune system problems -IUD in place -liver disease or tumor -problems with blood clots or take blood-thinners -use intravenous drugs -uterus of unusual shape -vaginal bleeding that has not been explained -an unusual or allergic reaction to levonorgestrel, other hormones, silicone, or polyethylene, medicines, foods, dyes, or preservatives -pregnant or trying to get pregnant -breast-feeding How should I use this medicine? This device is placed inside the uterus by a health care professional. Talk to your pediatrician regarding the use of this medicine in children. Special care may be needed. Overdosage: If you think you have taken too much of this medicine contact a poison control center or emergency room at once. NOTE: This medicine is only for you. Do not share this medicine with others. What if I miss a dose? This does not apply. What may interact with this medicine? Do not take this medicine with any of the following  medications: -amprenavir -bosentan -fosamprenavir This medicine may also interact with the following medications: -aprepitant -barbiturate medicines for inducing sleep or treating seizures -bexarotene -griseofulvin -medicines to treat seizures like carbamazepine, ethotoin, felbamate, oxcarbazepine, phenytoin, topiramate -modafinil -pioglitazone -rifabutin -rifampin -rifapentine -some medicines to treat HIV infection like atazanavir, indinavir, lopinavir, nelfinavir, tipranavir, ritonavir -St. John's wort -warfarin This list may not describe all possible interactions. Give your health care provider a list of all the medicines, herbs, non-prescription drugs, or dietary supplements you use. Also tell them if you smoke, drink alcohol, or use illegal drugs. Some items may interact with your medicine. What should I watch for while using this medicine? Visit your doctor or health care professional for regular check ups. See your doctor if you or your partner has sexual contact with others, becomes HIV positive, or gets a sexual transmitted disease. This product does not protect you against HIV infection (AIDS) or other sexually transmitted diseases. You can check the placement of the IUD yourself by reaching up to the top of your vagina with clean fingers to feel the threads. Do not pull on the threads. It is a good habit to check placement after each menstrual period. Call your doctor right away if you feel more of the IUD than just the threads or if you cannot feel the threads at all. The IUD may come out by itself. You may become pregnant if the device comes out. If you notice that the IUD has come out use a backup birth control method like condoms and call your health care provider. Using tampons will not change the position of the IUD and are okay to use during your period. What side effects may   I notice from receiving this medicine? Side effects that you should report to your doctor or  health care professional as soon as possible: -allergic reactions like skin rash, itching or hives, swelling of the face, lips, or tongue -fever, flu-like symptoms -genital sores -high blood pressure -no menstrual period for 6 weeks during use -pain, swelling, warmth in the leg -pelvic pain or tenderness -severe or sudden headache -signs of pregnancy -stomach cramping -sudden shortness of breath -trouble with balance, talking, or walking -unusual vaginal bleeding, discharge -yellowing of the eyes or skin Side effects that usually do not require medical attention (report to your doctor or health care professional if they continue or are bothersome): -acne -breast pain -change in sex drive or performance -changes in weight -cramping, dizziness, or faintness while the device is being inserted -headache -irregular menstrual bleeding within first 3 to 6 months of use -nausea This list may not describe all possible side effects. Call your doctor for medical advice about side effects. You may report side effects to FDA at 1-800-FDA-1088. Where should I keep my medicine? This does not apply. NOTE: This sheet is a summary. It may not cover all possible information. If you have questions about this medicine, talk to your doctor, pharmacist, or health care provider.  2015, [employer]vier/[employer] Standard. (2011-03-09 13:54:04)  

## 2014-01-12 LAB — CYTOLOGY - PAP

## 2014-11-03 ENCOUNTER — Encounter: Payer: Self-pay | Admitting: Physician Assistant

## 2014-11-03 ENCOUNTER — Ambulatory Visit (INDEPENDENT_AMBULATORY_CARE_PROVIDER_SITE_OTHER): Payer: Commercial Managed Care - PPO | Admitting: Physician Assistant

## 2014-11-03 VITALS — BP 135/90 | HR 87 | Temp 98.7°F | Ht 63.0 in | Wt 130.0 lb

## 2014-11-03 DIAGNOSIS — G43001 Migraine without aura, not intractable, with status migrainosus: Secondary | ICD-10-CM | POA: Diagnosis not present

## 2014-11-03 DIAGNOSIS — G729 Myopathy, unspecified: Secondary | ICD-10-CM

## 2014-11-03 DIAGNOSIS — M6289 Other specified disorders of muscle: Secondary | ICD-10-CM

## 2014-11-03 DIAGNOSIS — J309 Allergic rhinitis, unspecified: Secondary | ICD-10-CM

## 2014-11-03 MED ORDER — SUMATRIPTAN SUCCINATE 100 MG PO TABS: 100.0000 mg | ORAL_TABLET | ORAL | Status: DC | PRN

## 2014-11-03 MED ORDER — BECLOMETHASONE DIPROPIONATE 80 MCG/ACT NA AERS: 2.0000 | INHALATION_SPRAY | Freq: Every day | NASAL | Status: DC

## 2014-11-03 MED ORDER — METHYLPREDNISOLONE SODIUM SUCC 125 MG IJ SOLR
125.0000 mg | Freq: Once | INTRAMUSCULAR | Status: AC
  Administered 2014-11-03: 125 mg via INTRAMUSCULAR

## 2014-11-03 MED ORDER — CYCLOBENZAPRINE HCL 10 MG PO TABS: 10.0000 mg | ORAL_TABLET | Freq: Three times a day (TID) | ORAL | Status: DC | PRN

## 2014-11-03 MED ORDER — KETOROLAC TROMETHAMINE 30 MG/ML IJ SOLN
30.0000 mg | Freq: Once | INTRAMUSCULAR | Status: AC
  Administered 2014-11-03: 30 mg via INTRAMUSCULAR

## 2014-11-03 NOTE — Progress Notes (Signed)
   Subjective:    Patient ID: Monica Fry, female    DOB: Oct 02, 1980, 34 y.o.   MRN: [id number]  HPI  Patient presents to the clinic with one week history of sinus congestion. She has a history of migraines. She is also aware of her ongoing allergies that made her migraines worse. She comes in today because her migraines have been getting worse with this recent exacerbation of sinus congestion. Last night she had her worst migraine yet. After migraine has resolved she has residual jaw tightness and even temporalis/all tenderness. With these recent migraines she has had some are, photophobia, phonophobia, dizziness and nausea. For her sinus congestion she has tried Sudafed, Zyrtec, Flonase and Motrin. She also takes Imitrex as needed. She denies any shortness of breath, chest pain, vomiting, ringing in the year. She does have a history of this happening worse in the fall and spring   Review of Systems  All other systems reviewed and are negative.      Objective:   Physical Exam  Constitutional: She is oriented to person, place, and time.  HENT:  Head: Normocephalic and atraumatic.  TMs are erythematous with some slight bulge.  There is tenderness over her frontal/maxillary/temporalis areas to palpation.  Bilateral nasal turbinates are red and swollen.  Oropharynx is erythematous without tonsillar swelling or exudate. There is some postnasal drip noted.  Eyes:  Bilateral conjunctiva are erythematous with some watery discharge.  Neck: Normal range of motion. Neck supple.  Cardiovascular: Normal rate, regular rhythm and normal heart sounds.   Pulmonary/Chest: Effort normal and breath sounds normal. She has no wheezes.  Lymphadenopathy:    She has no cervical adenopathy.  Neurological: She is alert and oriented to person, place, and time.  Psychiatric: She has a normal mood and affect. Her behavior is normal.          Assessment & Plan:  Allergic sinusitis-I did not see any  cause of bacterial sinusitis today. Encouraged patient to continue Sudafed, consider Mucinex, switched Flonase to every nasal. Sample of every nasal was given in office today. Continue Zyrtec as well. Shot of Solu-Medrol 125 mg IM given in office today.  Migraine/muscle tightness-continue to use Imitrex as needed at acute onset. Shot of Toradol 30 mg IM given in office today. Flexeril was given to take at night for her jaw and neck tightness after migraine.   Reviewed and addendum made by Tandy Gaw PA-C.

## 2015-01-11 ENCOUNTER — Encounter: Payer: Self-pay | Admitting: Obstetrics & Gynecology

## 2015-01-11 ENCOUNTER — Ambulatory Visit (INDEPENDENT_AMBULATORY_CARE_PROVIDER_SITE_OTHER): Payer: Commercial Managed Care - PPO | Admitting: Obstetrics & Gynecology

## 2015-01-11 VITALS — BP 138/90 | Resp 16 | Ht 62.0 in | Wt 137.0 lb

## 2015-01-11 DIAGNOSIS — Z01419 Encounter for gynecological examination (general) (routine) without abnormal findings: Secondary | ICD-10-CM | POA: Diagnosis not present

## 2015-01-11 DIAGNOSIS — Z87410 Personal history of cervical dysplasia: Secondary | ICD-10-CM | POA: Diagnosis not present

## 2015-01-11 DIAGNOSIS — Z30015 Encounter for initial prescription of vaginal ring hormonal contraceptive: Secondary | ICD-10-CM

## 2015-01-11 MED ORDER — HYDROCORTISONE ACE-PRAMOXINE 1-1 % RE FOAM: 1.0000 | Freq: Two times a day (BID) | RECTAL | Status: DC

## 2015-01-11 MED ORDER — SULFAMETHOXAZOLE-TRIMETHOPRIM 800-160 MG PO TABS: ORAL_TABLET | ORAL | Status: DC

## 2015-01-12 ENCOUNTER — Encounter: Payer: Self-pay | Admitting: Obstetrics & Gynecology

## 2015-01-12 DIAGNOSIS — Z87410 Personal history of cervical dysplasia: Secondary | ICD-10-CM | POA: Insufficient documentation

## 2015-01-12 NOTE — Progress Notes (Signed)
  Subjective:     Monica Fry is a 34 y.o. female here for a routine exam.  Current complaints: occasionally skps OCPs and has a bleed.  Interested in another method--possibly the [employer].  Pt uses OCPs for dysmenorrhea (uses continuous)  Gynecologic History No LMP recorded. Patient is not currently having periods (Reason: Oral contraceptives). Contraception: OCP (estrogen/progesterone) Last Pap: 2015. Results were: with negative co testing Last mammogram: n/a  Obstetric History OB History  Gravida Para Term Preterm AB SAB TAB Ectopic Multiple Living  2 2 2       2     # Outcome Date GA Lbr Len/2nd Weight Sex Delivery Anes PTL Lv  2 Term 10/26/09 8752w2d 05:00 7 lb 9 oz (3.43 kg) F Vag-Spont EPI  Y  1 Term 06/27/07 7252w2d 12:00 7 lb 11 oz (3.487 kg) F Vag-Spont EPI  Y       The following portions of the patient's history were reviewed and updated as appropriate: allergies, current medications, past family history, past medical history, past social history, past surgical history and problem list.  Review of Systems Pertinent items noted in HPI and remainder of comprehensive ROS otherwise negative.    Objective:      Filed Vitals:   01/11/15 1518  BP: 138/90  Resp: 16  Height: 5\' 2"  (1.575 m)  Weight: 137 lb (62.143 kg)   Vitals:  WNL General appearance: alert, cooperative and no distress  HEENT: Normocephalic, without obvious abnormality, atraumatic Eyes: negative Throat: lips, mucosa, and tongue normal; teeth and gums normal  Respiratory: Clear to auscultation bilaterally  CV: Regular rate and rhythm  Breasts:  Normal appearance, no masses or tenderness, no nipple retraction or dimpling  GI: Soft, non-tender; bowel sounds normal; no masses,  no organomegaly  GU: External Genitalia:  Tanner V, no lesion Urethra:  No prolapse   Vagina: Pink, normal rugae, no blood or discharge  Cervix: No CMT, no lesion  Uterus:  Normal size and contour, non tender  Adnexa:  Normal, no masses, non tender  Musculoskeletal: No edema, redness or tenderness in the calves or thighs  Skin: No lesions or rash  Lymphatic: Axillary adenopathy: none    Psychiatric: Normal mood and behavior          Assessment:    Healthy female exam.   Borderline BP   Plan:    Education reviewed: self breast exams. Contraception: NuvaRing vaginal inserts and vasectomy. Follow up in: 1 year. Pt to come back in for BP check or she can call with home BPs.     Continuous Nuva Ring with no drug free week.  Per [facility], Pt can go to q3 year screenings.

## 2015-01-13 ENCOUNTER — Telehealth: Payer: Self-pay | Admitting: *Deleted

## 2015-01-13 NOTE — Telephone Encounter (Signed)
-----   Message from Lesly DukesKelly H Leggett, MD sent at 01/12/2015  6:30 PM EST ----- Can you call patient and ask her to take some home BPs and call them in or have her come in for a BP check.  Whatever is more convenient.

## 2015-01-13 NOTE — Telephone Encounter (Signed)
LM on voicemail to take several BP's at home and call into office due to her slightly elevated levels in the office this week.

## 2015-01-21 ENCOUNTER — Other Ambulatory Visit: Payer: Self-pay | Admitting: Obstetrics & Gynecology

## 2015-01-22 ENCOUNTER — Telehealth: Payer: Self-pay | Admitting: *Deleted

## 2015-01-22 DIAGNOSIS — N946 Dysmenorrhea, unspecified: Secondary | ICD-10-CM

## 2015-01-22 MED ORDER — NORETHINDRONE-ETH ESTRADIOL 1-35 MG-MCG PO TABS: ORAL_TABLET | ORAL | Status: DC

## 2015-01-22 NOTE — Telephone Encounter (Signed)
Pt called with her BP's this week: 122/85, 118/82, 134/90.  She wishes to go back on the OCP's instead of the Nuvaring .  [facility].  Pt is scheduled to see her PCP for annual within the month.

## 2015-01-29 ENCOUNTER — Telehealth: Payer: Self-pay | Admitting: *Deleted

## 2015-01-29 DIAGNOSIS — Z0000 Encounter for general adult medical examination without abnormal findings: Secondary | ICD-10-CM

## 2015-01-29 NOTE — Telephone Encounter (Signed)
Fasting labs ordered for upcoming cpe.

## 2015-02-02 ENCOUNTER — Ambulatory Visit (INDEPENDENT_AMBULATORY_CARE_PROVIDER_SITE_OTHER): Payer: Commercial Managed Care - PPO | Admitting: Physician Assistant

## 2015-02-02 ENCOUNTER — Encounter: Payer: Self-pay | Admitting: Physician Assistant

## 2015-02-02 VITALS — BP 118/82 | HR 104 | Ht 62.0 in | Wt 136.0 lb

## 2015-02-02 DIAGNOSIS — Z0000 Encounter for general adult medical examination without abnormal findings: Secondary | ICD-10-CM | POA: Diagnosis not present

## 2015-02-02 DIAGNOSIS — N898 Other specified noninflammatory disorders of vagina: Secondary | ICD-10-CM

## 2015-02-02 DIAGNOSIS — R030 Elevated blood-pressure reading, without diagnosis of hypertension: Secondary | ICD-10-CM | POA: Diagnosis not present

## 2015-02-02 DIAGNOSIS — IMO0001 Reserved for inherently not codable concepts without codable children: Secondary | ICD-10-CM

## 2015-02-02 LAB — COMPLETE METABOLIC PANEL WITH GFR
ALT: 11 U/L (ref 6–29)
AST: 17 U/L (ref 10–30)
Albumin: 4 g/dL (ref 3.6–5.1)
Alkaline Phosphatase: 34 U/L (ref 33–115)
BUN: 13 mg/dL (ref 7–25)
CHLORIDE: 103 mmol/L (ref 98–110)
CO2: 25 mmol/L (ref 20–31)
Calcium: 9.3 mg/dL (ref 8.6–10.2)
Creat: 1.05 mg/dL (ref 0.50–1.10)
GFR, EST AFRICAN AMERICAN: 80 mL/min (ref >=60)
GFR, EST NON AFRICAN AMERICAN: 69 mL/min (ref >=60)
Glucose, Bld: 68 mg/dL (ref 65–99)
Potassium: 3.8 mmol/L (ref 3.5–5.3)
Sodium: 138 mmol/L (ref 135–146)
Total Bilirubin: 0.5 mg/dL (ref 0.2–1.2)
Total Protein: 6.6 g/dL (ref 6.1–8.1)

## 2015-02-02 LAB — LIPID PANEL
Cholesterol: 162 mg/dL (ref 125–200)
HDL: 60 mg/dL (ref >=46)
LDL CALC: 78 mg/dL (ref <130)
TRIGLYCERIDES: 120 mg/dL (ref <150)
Total CHOL/HDL Ratio: 2.7 Ratio (ref <=5.0)
VLDL: 24 mg/dL (ref <30)

## 2015-02-02 NOTE — Progress Notes (Signed)
  Subjective:     Monica Fry is a 34 y.o. female and is here for a comprehensive physical exam. The patient reports problems - she feels great. GYN wanted her to follow up with BP since starting OCP. denies any CP, palpitations, headaches or diizziness. .  Social History   Social History  . Marital Status: Married    Spouse Name: N/A  . Number of Children: N/A  . Years of Education: N/A   Occupational History  . pharmacist    Social History Main Topics  . Smoking status: Never Smoker   . Smokeless tobacco: Never Used  . Alcohol Use: Yes     Comment: very socially  . Drug Use: No  . Sexual Activity:    Partners: Male    Birth Control/ Protection: Pill   Other Topics Concern  . Not on file   Social History Narrative   Health Maintenance  Topic Date Due  . HIV Screening  09/07/1994  . INFLUENZA VACCINE  09/21/2015  . PAP SMEAR  01/08/2017  . TETANUS/TDAP  11/15/2023    The following portions of the patient's history were reviewed and updated as appropriate: allergies, current medications, past family history, past medical history, past social history, past surgical history and problem list.  Review of Systems Pertinent items noted in HPI and remainder of comprehensive ROS otherwise negative.   Objective:    BP 118/82 mmHg  Pulse 104  Ht 5\' 2"  (1.575 m)  Wt 136 lb (61.689 kg)  BMI 24.87 kg/m2 General appearance: alert, cooperative and appears stated age Head: Normocephalic, without obvious abnormality, atraumatic Eyes: conjunctivae/corneas clear. PERRL, EOM's intact. Fundi benign. Ears: normal TM's and external ear canals both ears Nose: Nares normal. Septum midline. Mucosa normal. No drainage or sinus tenderness. Throat: lips, mucosa, and tongue normal; teeth and gums normal Neck: no adenopathy, no carotid bruit, no JVD, supple, symmetrical, trachea midline and thyroid not enlarged, symmetric, no tenderness/mass/nodules Back: symmetric, no curvature. ROM  normal. No CVA tenderness. Lungs: clear to auscultation bilaterally Heart: regular rate and rhythm, S1, S2 normal, no murmur, click, rub or gallop Abdomen: soft, non-tender; bowel sounds normal; no masses,  no organomegaly Extremities: extremities normal, atraumatic, no cyanosis or edema Pulses: 2+ and symmetric Skin: Skin color, texture, turgor normal. No rashes or lesions Lymph nodes: Cervical, supraclavicular, and axillary nodes normal. Neurologic: Grossly normal    Assessment:    Healthy female exam.      Plan:    CPE- discussed normal labs. Flu shot up to date. Will get HIV screening next year. Discussed vitamin D and Calcium 1500mg . Continue healthy diet and regular exercise.   OCP/elevated blood pressure readings/vaginal discharge- sounds like mix between period and discharge. Offered to do wet prep. Pt declined. She will follow up at GYN office if continues after finishing first pack. Rechecked BP in office looks great. Reassured pt.  See After Visit Summary for Counseling Recommendations

## 2015-02-02 NOTE — Patient Instructions (Signed)

## 2015-05-04 ENCOUNTER — Encounter: Payer: Self-pay | Admitting: Family Medicine

## 2015-05-04 ENCOUNTER — Ambulatory Visit (INDEPENDENT_AMBULATORY_CARE_PROVIDER_SITE_OTHER): Payer: Commercial Managed Care - PPO | Admitting: Family Medicine

## 2015-05-04 VITALS — BP 129/88 | HR 83 | Temp 98.3°F | Wt 147.0 lb

## 2015-05-04 DIAGNOSIS — R058 Other specified cough: Secondary | ICD-10-CM

## 2015-05-04 DIAGNOSIS — R05 Cough: Secondary | ICD-10-CM | POA: Diagnosis not present

## 2015-05-04 MED ORDER — PREDNISONE 20 MG PO TABS: 40.0000 mg | ORAL_TABLET | Freq: Every day | ORAL | Status: DC

## 2015-05-04 MED ORDER — HYDROCODONE-HOMATROPINE 5-1.5 MG/5ML PO SYRP: 5.0000 mL | ORAL_SOLUTION | Freq: Every evening | ORAL | Status: DC | PRN

## 2015-05-04 NOTE — Progress Notes (Signed)
   Subjective:    Patient ID: Crist Fatandace C Kunesh, female    DOB: September 27, 1980, 35 y.o.   MRN: [id number]  HPI Cough x 3 wks was productive not now, she has been taking nyquil, zyrtec, and flonase.  Not sleeping well. Cough is really forceful.  No fever, chills or SOB.  No wheezing. No childhood asthma.  Sinus pressure has resolved.  Cough is more dry and spasming.    Review of Systems     Objective:   Physical Exam  Constitutional: She is oriented to person, place, and time. She appears well-developed and well-nourished.  HENT:  Head: Normocephalic and atraumatic.  Right Ear: External ear normal.  Left Ear: External ear normal.  Nose: Nose normal.  Mouth/Throat: Oropharynx is clear and moist.  TMs and canals are clear.   Eyes: Conjunctivae and EOM are normal. Pupils are equal, round, and reactive to light.  Neck: Neck supple. No thyromegaly present.  Cardiovascular: Normal rate, regular rhythm and normal heart sounds.   Pulmonary/Chest: Effort normal and breath sounds normal. She has no wheezes.  Lymphadenopathy:    She has no cervical adenopathy.  Neurological: She is alert and oriented to person, place, and time.  Skin: Skin is warm and dry.  Psychiatric: She has a normal mood and affect.          Assessment & Plan:  Post viral cough with AR - will tx with oral prednisone and night-time cough med. Call if not better in one week.

## 2015-07-15 ENCOUNTER — Encounter: Payer: Self-pay | Admitting: Sports Medicine

## 2015-07-15 ENCOUNTER — Ambulatory Visit (INDEPENDENT_AMBULATORY_CARE_PROVIDER_SITE_OTHER): Payer: Commercial Managed Care - PPO | Admitting: Sports Medicine

## 2015-07-15 VITALS — BP 130/85 | HR 80 | Resp 18 | Wt 135.9 lb

## 2015-07-15 DIAGNOSIS — M7552 Bursitis of left shoulder: Secondary | ICD-10-CM

## 2015-07-15 MED ORDER — ESOMEPRAZOLE MAGNESIUM 40 MG PO CPDR: DELAYED_RELEASE_CAPSULE | ORAL | Status: DC

## 2015-07-15 NOTE — Addendum Note (Signed)
Addended by: Monica BectonHEKKEKANDAM, Clariece Roesler J on: 07/15/2015 04:43 PM   Modules accepted: Orders

## 2015-07-15 NOTE — Assessment & Plan Note (Addendum)
Injection as above, x-rays, formal physical therapy, return to see me in 6 weeks.  [place] Had a bit of gastritis with prednisone in the past so I am going to add Nexium with her shot today.

## 2015-07-15 NOTE — Progress Notes (Signed)
   Subjective:    I'm seeing this patient as a consultation for:  Tandy GawJade Breeback, PA-C  CC: Left shoulder pain  HPI:  This is a pleasant 35 year old female, she comes in with a two-month history of pain that she localizes of the deltoid, worse with abduction. Moderate, persistent. She's taken some anti-inflammatories, and improved some, pain does not wake her from sleep, but she still has discomfort with radiation to the elbow. No neck pain, no paresthesias.  Past medical history, Surgical history, Family history not pertinant except as noted below, Social history, Allergies, and medications have been entered into the medical record, reviewed, and no changes needed.   Review of Systems: No headache, visual changes, nausea, vomiting, diarrhea, constipation, dizziness, abdominal pain, skin rash, fevers, chills, night sweats, weight loss, swollen lymph nodes, body aches, joint swelling, muscle aches, chest pain, shortness of breath, mood changes, visual or auditory hallucinations.   Objective:   General: Well Developed, well nourished, and in no acute distress.  Neuro/Psych: Alert and oriented x3, extra-ocular muscles intact, able to move all 4 extremities, sensation grossly intact. Skin: Warm and dry, no rashes noted.  Respiratory: Not using accessory muscles, speaking in full sentences, trachea midline.  Cardiovascular: Pulses palpable, no extremity edema. Abdomen: Does not appear distended. Left Shoulder: Inspection reveals no abnormalities, atrophy or asymmetry. Palpation is normal with no tenderness over AC joint or bicipital groove. ROM is full in all planes. Rotator cuff strength weak to abduction Positive Neer and Hawkin's tests, empty can. Speeds and Yergason's tests normal. No labral pathology noted with negative Obrien's, negative crank, negative clunk, and good stability. Normal scapular function observed. No painful arc and no drop arm sign. No apprehension sign  Procedure:  Real-time Ultrasound Guided Injection of subacromial bursa left Device: GE Logiq E  Verbal informed consent obtained.  Time-out conducted.  Noted no overlying erythema, induration, or other signs of local infection.  Skin prepped in a sterile fashion.  Local anesthesia: Topical Ethyl chloride.  With sterile technique and under real time ultrasound guidance:  25-gauge needle advanced into the bursa and 1 mL kenalog 40, 1 mL lidocaine, 1 mL Marcaine injected easily. Completed without difficulty  Pain immediately resolved suggesting accurate placement of the medication.  Advised to call if fevers/chills, erythema, induration, drainage, or persistent bleeding.  Images permanently stored and available for review in the ultrasound unit.  Impression: Technically successful ultrasound guided injection.  Impression and Recommendations:   This case required medical decision making of moderate complexity.

## 2015-07-21 ENCOUNTER — Ambulatory Visit (INDEPENDENT_AMBULATORY_CARE_PROVIDER_SITE_OTHER): Payer: Commercial Managed Care - PPO | Admitting: Rehabilitative and Restorative Service Providers"

## 2015-07-21 ENCOUNTER — Encounter: Payer: Self-pay | Admitting: Rehabilitative and Restorative Service Providers"

## 2015-07-21 DIAGNOSIS — M25512 Pain in left shoulder: Secondary | ICD-10-CM | POA: Diagnosis not present

## 2015-07-21 DIAGNOSIS — R293 Abnormal posture: Secondary | ICD-10-CM | POA: Diagnosis not present

## 2015-07-21 DIAGNOSIS — R29898 Other symptoms and signs involving the musculoskeletal system: Secondary | ICD-10-CM | POA: Diagnosis not present

## 2015-07-21 NOTE — Patient Instructions (Signed)
Axial Extension (Chin Tuck)    Pull chin in and lengthen back of neck. Hold _10___ seconds while counting out loud. Repeat __10__ times. Do __several__ sessions per day.   Shoulder Blade Squeeze   Can use noodle along spine  Rotate shoulders back, then squeeze shoulder blades down and back . Hold 10 sec Repeat __10__ times. Do __several __ sessions per day.   Myofacial ball work using ~4 in Engineering geologistrubber ball   Scapula Adduction With Pectoralis Stretch: Low - Standing   Shoulders at 45 hands even with shoulders, keeping weight through legs, shift weight forward until you feel pull or stretch through the front of your chest. Hold _30__ seconds. Do _3__ times, _2-4__ times per day.   Scapula Adduction With Pectoralis Stretch: Mid-Range - Standing   Shoulders at 90 elbows even with shoulders, keeping weight through legs, shift weight forward until you feel pull or strength through the front of your chest. Hold __30_ seconds. Do _3__ times, __2-4_ times per day.   Scapula Adduction With Pectoralis Stretch: High - Standing   Shoulders at 120 hands up high on the doorway, keeping weight on feet, shift weight forward until you feel pull or stretch through the front of your chest. Hold _30__ seconds. Do _3__ times, _2-3__ times per day.   Resisted External Rotation: in Neutral - Bilateral   PALMS UP Sit or stand, tubing in both hands, elbows at sides, bent to 90, forearms forward. Pinch shoulder blades together and rotate forearms out. Keep elbows at sides. Repeat __10__ times per set. Do _2-3___ sets per session. Do _2-3___ sessions per day.   Low Row: Standing   Face anchor, feet shoulder width apart. Palms up, pull arms back, squeezing shoulder blades together. Repeat 10__ times per set. Do 2-3__ sets per session. Do 2-3__ sessions per week. Anchor Height: Waist   Strengthening: Resisted Extension   Hold tubing in right hand, arm forward. Pull arm back, elbow  straight. Repeat _10___ times per set. Do 2-3____ sets per session. Do 2-3____ sessions per day.   Lying on back with hips and knees bent - arms out to side  Start with arms at ~90 deg - then move toward arms at 120 deg  5 min stretch - can bend elbows during the stretch as needed to rest - then return to stretch      TENS UNIT: This is helpful for muscle pain and spasm.   Search and Purchase a TENS 7000 2nd edition at www.tenspros.com. It should be less than $30.     TENS unit instructions: Do not shower or bathe with the unit on Turn the unit off before removing electrodes or batteries If the electrodes lose stickiness add a drop of water to the electrodes after they are disconnected from the unit and place on plastic sheet. If you continued to have difficulty, call the TENS unit company to purchase more electrodes. Do not apply lotion on the skin area prior to use. Make sure the skin is clean and dry as this will help prolong the life of the electrodes. After use, always check skin for unusual red areas, rash or other skin difficulties. If there are any skin problems, does not apply electrodes to the same area. Never remove the electrodes from the unit by pulling the wires. Do not use the TENS unit or electrodes other than as directed. Do not change electrode placement without consultating your therapist or physician. Keep 2 fingers with between each electrode.

## 2015-07-21 NOTE — Therapy (Addendum)
[facility] [place], [place], [address] Phone: [phone]   Fax:  [phone]  Physical Therapy Evaluation  Patient Details  Name: Monica Fry MRN: [id number] Date of Birth: 12-20-1980 Referring Provider: Dr. Sophronia Simas   Encounter Date: 07/21/2015      PT End of Session - 07/21/15 1105    Visit Number 1   Number of Visits 4   Date for PT Re-Evaluation 09/01/15   PT Start Time 6962   PT Stop Time 1058   PT Time Calculation (min) 40 min   Activity Tolerance Patient tolerated treatment well      Past Medical History  Diagnosis Date  . Abnormal Pap smear of cervix 2010  . Anemia     Past Surgical History  Procedure Laterality Date  . Cryotherapy    . Laparoscopy  2004    exploratory for endometriosis    There were no vitals filed for this visit.       Subjective Assessment - 07/21/15 1021    Subjective patient reports tht she has been exercising when she was doing a side plank when she felt pain in the Lt shoudler - from shoulder to elbow about 2-3 months ago. She was seen by MD and received injection 07/15/15 with good resplutioin of pain.    Pertinent History denies any previous musculoskeletal problems    Diagnostic tests [place]    Patient Stated Goals get shoulder stronger and avoid recurrent problems    Currently in Pain? No/denies   Pain Score 0-No pain   Pain Location Shoulder   Pain Orientation Left   Pain Descriptors / Indicators Aching;Burning  at worst    Pain Type Acute pain   Pain Radiating Towards shoulder to elbow (nbone in the past month    Pain Onset 1 to 4 weeks ago   Pain Frequency Intermittent   Pain Relieving Factors injection             [facility] PT Assessment - 07/21/15 0001    Assessment   Medical Diagnosis Lt shoulder bursitis    Referring Provider Dr. Sophronia Simas    Onset Date/Surgical Date 05/15/15   Hand Dominance Right   Next MD Visit as needed in  ~6 weeks    Prior Therapy none   Precautions   Precautions None   Balance Screen   Has the patient fallen in the past 6 months No   Has the patient had a decrease in activity level because of a fear of falling?  No   Is the patient reluctant to leave their home because of a fear of falling?  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    Leisure household chores; child care 36 & 58 yr old; walking and some exercise on a regular basis    Observation/Other Assessments   Focus on Therapeutic Outcomes (FOTO)  26% limitation    Sensation   Additional Comments WFL's    Posture/Postural Control   Posture Comments head forward; shoulders rounded and elevated; head of the humerus anterior in orientation; scapulae abducted and rotated along the thoracic wall; head of the humerus anterior in orientation    AROM   Overall AROM Comments AROM bilat shoulders and cervical spine WNL's - some end range tightness in cervical rotation and lateral flexion - no discomfort with any  motion shds/c-spine    Strength   Overall Strength Comments 5/5 bilat UE's some discomfort with resistive testing Lt shoulder flex/ER    Palpation   Palpation comment significant tightness through bilat pecs; upper trap; leveator and to lesser extent the periscapular musculature Lt                    [facility] Adult PT Treatment/Exercise - 07/21/15 0001    Therapeutic Activites    Therapeutic Activities --  myofacial ball release work w/~4 Social worker ball    Neuro Re-ed    Neuro Re-ed Details  working on posture and alignment in standing - engaging posterior shoulder girdle    Shoulder Exercises: Standing   Extension Strengthening;Both;10 reps;Theraband   Theraband Level (Shoulder Extension) Level 2 (Red)   Row Strengthening;Both;10 reps;Theraband   Theraband Level (Shoulder Row)  Level 2 (Red)   Retraction Strengthening;Both;20 reps;Theraband   Theraband Level (Shoulder Retraction) Level 1 (Yellow)   Other Standing Exercises scap squeeze 10 sec x 10 with noodle    Shoulder Exercises: Stretch   Other Shoulder Stretches doorway stretch 3 positions 30 sec x 3 reps   Other Shoulder Stretches supine snow angel prolonged stretch 2-3 min with noodle arms at 90-110 deg                 PT Education - 07/21/15 1045    Education provided Yes   Education Details HEP postural correction    Person(s) Educated Patient   Methods Explanation;Demonstration;Tactile cues;Verbal cues;Handout   Comprehension Verbalized understanding;Returned demonstration;Verbal cues required;Tactile cues required          PT Short Term Goals - 07/21/15 1119    PT SHORT TERM GOAL #1   Title Instruct patient in appropriate HEP 07/21/15   Time 1   Period Days   Status Achieved   PT SHORT TERM GOAL #2   Title Instruct patient in appropriate postural correction with good alognment through head and shoulder girdle - engaging posterior shoulder girdle 07/21/15   Time 1   Period Days   Status Achieved           PT Long Term Goals - 07/21/15 1121    PT LONG TERM GOAL #1   Title Progress with additional stretching for anterior chest/pecs and strengthening for posterior shoulder girdle as patient reschedules 09/01/15   Time 6   Period Weeks   Status New   PT LONG TERM GOAL #2   Title Improve posture and alignment with patient to demonstrate improved position of scapulae along the thoracic wall 09/01/15   Time 6   Period Weeks   Status New   PT LONG TERM GOAL #3   Title Independent in HEP 09/01/15   Time 6   Period Weeks   Status New   PT LONG TERM GOAL #4   Title Improve FOTO to </= 22% limitation 09/01/15   Time 6   Period Weeks   Status New               Plan - 07/21/15 1114    Clinical Impression Statement Patient presents with resolving Lt shoudler pain. She has  poor posture and alignment and poor biomechanics through the shoulder girdle complex with muscular imbalance; tightness through the anterior chest and upper traps; poor ergonomic positions for work and home activities. 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 Patient will call with any questions or concerns and schedule additioinal  appointments if needed.    Rehab Potential Good   PT Frequency 1x / week   PT Duration 4 weeks   PT Treatment/Interventions Patient/family education;ADLs/Self Care Home Management;Therapeutic exercise;Therapeutic activities;Cryotherapy;Electrical Stimulation;Iontophoresis 61m/ml Dexamethasone;Moist Heat;Ultrasound;Manual techniques;Dry needling;Neuromuscular re-education   PT Next Visit Plan Patient will call with any questions or if she wishes to schedule additional appointments. Discussed trial of TDN but patient declined today. She will call to schedule if she wants to proceed with TDN or additional appts    Consulted and Agree with Plan of Care Patient      Patient will benefit from skilled therapeutic intervention in order to improve the following deficits and impairments:  Postural dysfunction, Improper body mechanics, Decreased activity tolerance  Visit Diagnosis: Pain in left shoulder - Plan: PT plan of care cert/re-cert  Abnormal posture - Plan: PT plan of care cert/re-cert  Other symptoms and signs involving the musculoskeletal system - Plan: PT plan of care cert/re-cert     Problem List Patient Active Problem List   Diagnosis Date Noted  . Bursitis of left shoulder 07/15/2015  . History of cervical dysplasia 01/12/2015  . Oral contraceptive use 01/08/2014  . H/O motion sickness 11/14/2013  . Closed fracture of fifth metatarsal bone of left foot 04/28/2013  . Allergic rhinitis 11/08/2012  . Seasonal allergies 11/08/2012  . Dysmenorrhea 10/30/2012    Celyn PNilda SimmerPT, MPH  07/21/2015, 11:25 AM  [facility] [address] [address]Phone: [phone]  Fax:  [phone] Name: CDENEICE WACKMRN: [id number] of Birth: [birth year]  PHYSICAL THERAPY DISCHARGE SUMMARY  Visits from Start of Care: Eval only  Current functional level related to goals / functional outcomes: unchanged   Remaining deficits: unchanged   Education / Equipment: Initial HEP Plan: Patient agrees to discharge.  Patient goals were not met. Patient is being discharged due to not returning since the last visit.  ?????     Celyn P. HHelene KelpPT, MPH 10/08/15 9:16 AM

## 2015-08-26 ENCOUNTER — Ambulatory Visit: Payer: Commercial Managed Care - PPO | Admitting: Sports Medicine

## 2015-10-18 ENCOUNTER — Other Ambulatory Visit: Payer: Self-pay | Admitting: Obstetrics & Gynecology

## 2015-10-18 DIAGNOSIS — N946 Dysmenorrhea, unspecified: Secondary | ICD-10-CM

## 2015-10-26 ENCOUNTER — Other Ambulatory Visit: Payer: Self-pay | Admitting: *Deleted

## 2015-10-26 DIAGNOSIS — N946 Dysmenorrhea, unspecified: Secondary | ICD-10-CM

## 2015-10-26 MED ORDER — NORETHINDRONE-ETH ESTRADIOL 1-35 MG-MCG PO TABS: ORAL_TABLET | ORAL | 4 refills | Status: DC

## 2015-10-26 NOTE — Telephone Encounter (Signed)
RF authorization sent to [facility] for OCP.  Pt takes continuously and will need 16 RF's when she comes in for her annual.

## 2016-01-03 ENCOUNTER — Encounter: Payer: Self-pay | Admitting: Family Medicine

## 2016-01-03 ENCOUNTER — Other Ambulatory Visit: Payer: Self-pay | Admitting: Physician Assistant

## 2016-01-03 MED ORDER — PREDNISONE 20 MG PO TABS: ORAL_TABLET | ORAL | 0 refills | Status: DC

## 2016-01-03 NOTE — Progress Notes (Signed)
[facility] [place] [facility] [facility] [place] she had previously when she saw Dr. Linford ArnoldMetheney. See my chart msg. Will send prednisone. Follow up if not improving.

## 2016-01-08 ENCOUNTER — Emergency Department (INDEPENDENT_AMBULATORY_CARE_PROVIDER_SITE_OTHER)
Admission: EM | Admit: 2016-01-08 | Discharge: 2016-01-08 | Disposition: A | Discharge status: Home / Self Care | Payer: Commercial Managed Care - PPO | Source: Home / Self Care | Attending: Family Medicine | Admitting: Family Medicine

## 2016-01-08 ENCOUNTER — Encounter: Payer: Self-pay | Admitting: Emergency Medicine

## 2016-01-08 DIAGNOSIS — L503 Dermatographic urticaria: Secondary | ICD-10-CM

## 2016-01-08 DIAGNOSIS — R21 Rash and other nonspecific skin eruption: Secondary | ICD-10-CM

## 2016-01-08 MED ORDER — METHYLPREDNISOLONE SODIUM SUCC 40 MG IJ SOLR
80.0000 mg | Freq: Once | INTRAMUSCULAR | Status: AC
  Administered 2016-01-08: 80 mg via INTRAMUSCULAR

## 2016-01-08 MED ORDER — HYDROXYZINE HCL 25 MG PO TABS: 25.0000 mg | ORAL_TABLET | Freq: Four times a day (QID) | ORAL | 0 refills | Status: DC | PRN

## 2016-01-08 MED ORDER — PREDNISONE 10 MG (21) PO TBPK: ORAL_TABLET | ORAL | 0 refills | Status: DC

## 2016-01-08 MED ORDER — MONTELUKAST SODIUM 10 MG PO TABS: 10.0000 mg | ORAL_TABLET | Freq: Every day | ORAL | 0 refills | Status: DC

## 2016-01-08 MED ORDER — DESLORATADINE 5 MG PO TABS
5.0000 mg | ORAL_TABLET | Freq: Every day | ORAL | 0 refills | Status: DC
Start: 2016-01-08 — End: 2016-02-04

## 2016-01-08 NOTE — ED Provider Notes (Signed)
CSN: [id number]     Arrival date & time 01/08/16  [id number] History   First MD Initiated Contact with Patient 01/08/16 1032     Chief Complaint  Patient presents with  . Rash   (Consider location/radiation/quality/duration/timing/severity/associated sxs/prior Treatment) HPI Monica Fry is a 35 y.o. female presenting to UC with c/o diffuse itching that started about 5 days ago.  Pt notes she developed URI symptoms about 1 week ago and was started on prednisone 4 days ago but rash started the day before.  Rash only comes up after she scratches her skin but she states she itches all over constantly.  The prednisone has helped her cough but not with the itching. She has also tried benadryl, Zyrtec, and oatmeal baths w/o relief.  Hx of similar symptoms when she was pregnant but notes she was not able to be treated while she was pregnant so the symptoms lasted for 4 weeks.  Itching keeps her up at night and has been causing extra stress on patient as she has not gotten any relief over the last 5 days. No new soaps, lotions, or medications.    Past Medical History:  Diagnosis Date  . Abnormal Pap smear of cervix 2010  . Anemia    Past Surgical History:  Procedure Laterality Date  . CRYOTHERAPY    . LAPAROSCOPY  2004   exploratory for endometriosis   Family History  Problem Relation Age of Onset  . Diabetes Father   . Heart disease Father   . Hypertension Father   . Hyperlipidemia Father   . Cancer Paternal Grandmother     breast and lung  . Thrombosis Maternal Grandmother   . Depression Maternal Grandmother   . Hyperlipidemia Maternal Grandmother   . Hypertension Maternal Grandmother   . Heart attack Paternal Grandfather   . Hyperlipidemia Paternal Grandfather   . Hypertension Paternal Grandfather    Social History  Substance Use Topics  . Smoking status: Never Smoker  . Smokeless tobacco: Never Used  . Alcohol use Yes     Comment: very socially   OB History    Gravida Para  Term Preterm AB Living   2 2 2     2    SAB TAB Ectopic Multiple Live Births           2     Review of Systems  Constitutional: Negative for chills and fever.  HENT: Negative for congestion, sore throat, trouble swallowing and voice change.   Respiratory: Positive for cough. Negative for chest tightness and shortness of breath.   Gastrointestinal: Negative for abdominal pain, diarrhea, nausea and vomiting.  Musculoskeletal: Negative for arthralgias, joint swelling and myalgias.  Skin: Positive for color change and rash. Negative for wound.    Allergies  Patient has no known allergies.  Home Medications   Prior to Admission medications   Medication Sig Start Date End Date Taking? Authorizing Provider  cetirizine (ZYRTEC) 10 MG tablet Take 10 mg by mouth daily.    Historical Provider, MD  cyclobenzaprine (FLEXERIL) 10 MG tablet Take 1 tablet (10 mg total) by mouth 3 (three) times daily as needed for muscle spasms. 11/03/14   Jade L Breeback, PA-C  desloratadine (CLARINEX) 5 MG tablet Take 1 tablet (5 mg total) by mouth daily. 01/08/16   Junius FinnerErin O'Malley, PA-C  esomeprazole (NEXIUM) 40 MG capsule One tab by mouth at dinner time. 07/15/15   Monica Bectonhomas J Thekkekandam, MD  fluticasone (FLONASE) 50 MCG/ACT nasal spray Place 2 sprays into  the nose as needed. Reported on 07/15/2015 11/08/12   Lonna CobbJade L Breeback, PA-C  HYDROcodone-homatropine (HYCODAN) 5-1.5 MG/5ML syrup Take 5 mLs by mouth at bedtime as needed for cough. 05/04/15   Agapito Gamesatherine D Metheney, MD  hydrOXYzine (ATARAX/VISTARIL) 25 MG tablet Take 1 tablet (25 mg total) by mouth every 6 (six) hours as needed for itching. 01/08/16   Junius FinnerErin O'Malley, PA-C  ibuprofen (ADVIL,MOTRIN) 200 MG tablet Take 200 mg by mouth as needed.     Historical Provider, MD  montelukast (SINGULAIR) 10 MG tablet Take 1 tablet (10 mg total) by mouth at bedtime. 01/08/16   Junius FinnerErin O'Malley, PA-C  Multiple Vitamin (MULTIVITAMIN) tablet Take 1 tablet by mouth daily.      Historical  Provider, MD  norethindrone-ethinyl estradiol 1/35 (ALAYCEN 1/35) tablet 1 active pill continuously 10/26/15 10/28/16  Allie BossierMyra C Dove, MD  predniSONE (DELTASONE) 20 MG tablet Take 2 tablets for 5 days. 01/03/16   Jade L Breeback, PA-C  predniSONE (STERAPRED UNI-PAK 21 TAB) 10 MG (21) TBPK tablet 3 tabs for 4 days, 2 tabs for 3 days, 1 tab for 3 days 01/08/16   Junius FinnerErin O'Malley, PA-C  SUMAtriptan (IMITREX) 100 MG tablet Take 1 tablet (100 mg total) by mouth every 2 (two) hours as needed for migraine. May repeat in 2 hours if headache persists or recurs. 11/03/14   Jomarie LongsJade L Breeback, PA-C   Meds Ordered and Administered this Visit   Medications  methylPREDNISolone sodium succinate (SOLU-MEDROL) 40 mg/mL injection 80 mg (80 mg Intramuscular Given 01/08/16 1050)    BP 132/98 (BP Location: Right Arm)   Pulse 99   Temp 98 F (36.7 C) (Oral)   Wt 135 lb (61.2 kg)   SpO2 99%   BMI 24.69 kg/m  No data found.   Physical Exam  Constitutional: She is oriented to person, place, and time. She appears well-developed and well-nourished. No distress.  HENT:  Head: Normocephalic and atraumatic.  Eyes: EOM are normal.  Neck: Normal range of motion.  Cardiovascular: Normal rate and regular rhythm.   Pulmonary/Chest: Effort normal and breath sounds normal. No respiratory distress. She has no wheezes. She has no rales.  Musculoskeletal: Normal range of motion.  Neurological: She is alert and oriented to person, place, and time.  Skin: Skin is warm and dry. Rash noted. She is not diaphoretic. There is erythema.  Raised erythematous rash on Right forearm in pattern of recent scratching. Rash does blanch. No other rashes. No bleeding or discharge.   Psychiatric: She has a normal mood and affect. Her behavior is normal.  Nursing note and vitals reviewed.   Urgent Care Course   Clinical Course     Procedures (including critical care time)  Labs Review Labs Reviewed - No data to display  Imaging Review No  results found.     MDM   1. Dermagraphy   2. Rash and nonspecific skin eruption    Pt c/o persistent itching.  Rash c/w dermagraphism.   Tx in UC: Solumedrol 80mg  IM  Rx: Prednisone treatment extended another 10 days in stepdown fashion.  Atarax for evening.  Singulair and Clarinex to take as well.  Encouraged f/u with PCP or dermatologist next week if not improving. Patient verbalized understanding and agreement with treatment plan.     Junius Finnerrin O'Malley, PA-C 01/08/16 1116

## 2016-01-08 NOTE — ED Triage Notes (Signed)
Pt c/o rash on entire body x5 days. States she was dx with bronchitis and started prednisone on Tuesday. She has been taking that with no relief for her rash and itching. She has been taking oatmeal baths. Has not tried and creams.

## 2016-02-04 ENCOUNTER — Encounter: Payer: Self-pay | Admitting: Physician Assistant

## 2016-02-04 ENCOUNTER — Ambulatory Visit (INDEPENDENT_AMBULATORY_CARE_PROVIDER_SITE_OTHER): Payer: Commercial Managed Care - PPO | Admitting: Physician Assistant

## 2016-02-04 VITALS — BP 132/88 | HR 92 | Ht 62.0 in | Wt 138.0 lb

## 2016-02-04 DIAGNOSIS — N946 Dysmenorrhea, unspecified: Secondary | ICD-10-CM | POA: Diagnosis not present

## 2016-02-04 DIAGNOSIS — Z131 Encounter for screening for diabetes mellitus: Secondary | ICD-10-CM | POA: Diagnosis not present

## 2016-02-04 DIAGNOSIS — Z0000 Encounter for general adult medical examination without abnormal findings: Secondary | ICD-10-CM

## 2016-02-04 DIAGNOSIS — L503 Dermatographic urticaria: Secondary | ICD-10-CM | POA: Diagnosis not present

## 2016-02-04 DIAGNOSIS — R011 Cardiac murmur, unspecified: Secondary | ICD-10-CM

## 2016-02-04 MED ORDER — NORETHINDRONE-ETH ESTRADIOL 1-35 MG-MCG PO TABS: ORAL_TABLET | ORAL | 0 refills | Status: DC

## 2016-02-04 MED ORDER — CYCLOBENZAPRINE HCL 10 MG PO TABS: 10.0000 mg | ORAL_TABLET | Freq: Three times a day (TID) | ORAL | 2 refills | Status: DC | PRN

## 2016-02-04 MED ORDER — SUMATRIPTAN SUCCINATE 100 MG PO TABS: 100.0000 mg | ORAL_TABLET | ORAL | 5 refills | Status: DC | PRN

## 2016-02-04 NOTE — Patient Instructions (Signed)

## 2016-02-05 ENCOUNTER — Other Ambulatory Visit: Payer: Self-pay | Admitting: Obstetrics & Gynecology

## 2016-02-05 DIAGNOSIS — N946 Dysmenorrhea, unspecified: Secondary | ICD-10-CM

## 2016-02-07 ENCOUNTER — Encounter: Payer: Self-pay | Admitting: Physician Assistant

## 2016-02-07 DIAGNOSIS — R011 Cardiac murmur, unspecified: Secondary | ICD-10-CM | POA: Insufficient documentation

## 2016-02-07 DIAGNOSIS — L503 Dermatographic urticaria: Secondary | ICD-10-CM | POA: Insufficient documentation

## 2016-02-07 NOTE — Progress Notes (Signed)
Subjective:     Monica Fry is a 35 y.o. female and is here for a comprehensive physical exam. The patient reports problems - pt has had some recent itching, rash, coughing episode. [facility] she would like allergist referral. .  Social History   Social History  . Marital status: Married    Spouse name: N/A  . Number of children: N/A  . Years of education: N/A   Occupational History  . pharmacist [employer]   Social History Main Topics  . Smoking status: Never Smoker  . Smokeless tobacco: Never Used  . Alcohol use Yes     Comment: very socially  . Drug use: No  . Sexual activity: Yes    Partners: Male    Birth control/ protection: Pill   Other Topics Concern  . Not on file   Social History Narrative  . No narrative on file   Health Maintenance  Topic Date Due  . HIV Screening  09/07/1994  . PAP SMEAR  01/08/2017  . TETANUS/TDAP  11/15/2023  . INFLUENZA VACCINE  Addressed    The following portions of the patient's history were reviewed and updated as appropriate: allergies, current medications, past family history, past medical history, past social history, past surgical history and problem list.  Review of Systems Pertinent items noted in HPI and remainder of comprehensive ROS otherwise negative.   Objective:    BP 132/88   Pulse 92   Ht 5\' 2"  (1.575 m)   Wt 138 lb (62.6 kg)   BMI 25.24 kg/m  General appearance: alert, cooperative and appears stated age Head: Normocephalic, without obvious abnormality, atraumatic Eyes: conjunctivae/corneas clear. PERRL, EOM's intact. Fundi benign. Ears: normal TM's and external ear canals both ears Nose: Nares normal. Septum midline. Mucosa normal. No drainage or sinus tenderness. Throat: lips, mucosa, and tongue normal; teeth and gums normal Neck: no adenopathy, no carotid bruit, no JVD, supple, symmetrical, trachea midline and thyroid not enlarged, symmetric, no tenderness/mass/nodules Back: symmetric, no  curvature. ROM normal. No CVA tenderness. Lungs: clear to auscultation bilaterally Heart: regular rate and rhythm, no click and supine only faint systolic heart murmur.  Abdomen: soft, non-tender; bowel sounds normal; no masses,  no organomegaly Extremities: extremities normal, atraumatic, no cyanosis or edema Pulses: 2+ and symmetric Skin: Skin color, texture, turgor normal. No rashes or lesions skin very reactive to touch. When scratches lines swell up and appear erythematous.  Lymph nodes: Cervical, supraclavicular, and axillary nodes normal. Neurologic: Alert and oriented X 3, normal strength and tone. Normal symmetric reflexes. Normal coordination and gait    Assessment:    Healthy female exam.      Plan:    Marland Kitchen.Marland Kitchen.Elie was seen today for annual exam.  Diagnoses and all orders for this visit:  Routine physical examination -     COMPLETE METABOLIC PANEL WITH GFR  Dermatographia -     COMPLETE METABOLIC PANEL WITH GFR -     Ambulatory referral to Allergy  Screening for diabetes mellitus -     COMPLETE METABOLIC PANEL WITH GFR  Painful menstrual periods -     norethindrone-ethinyl estradiol 1/35 (ALAYCEN 1/35) tablet; 1 active pill continuously  Other orders -     cyclobenzaprine (FLEXERIL) 10 MG tablet; Take 1 tablet (10 mg total) by mouth 3 (three) times daily as needed for muscle spasms. -     SUMAtriptan (IMITREX) 100 MG tablet; Take 1 tablet (100 mg total) by mouth every 2 (two) hours as  needed for migraine. May repeat in 2 hours if headache persists or recurs.   New heart murmur. Only supine. She has been on a lot of prednisone and allergy medications. Recheck in 1-2 months to see if still present. Discussed symptoms of heart murmurs. Follow up as needed.   Will make referral to allergy.see [facility] note. She continues to still have rash that appears after touching or scratching skin.  See After Visit Summary for Counseling Recommendations

## 2016-02-09 LAB — COMPLETE METABOLIC PANEL WITH GFR
ALK PHOS: 36 U/L (ref 33–115)
ALT: 10 U/L (ref 6–29)
AST: 12 U/L (ref 10–30)
Albumin: 3.8 g/dL (ref 3.6–5.1)
BILIRUBIN TOTAL: 0.4 mg/dL (ref 0.2–1.2)
BUN: 12 mg/dL (ref 7–25)
CO2: 25 mmol/L (ref 20–31)
CREATININE: 0.89 mg/dL (ref 0.50–1.10)
Calcium: 8.8 mg/dL (ref 8.6–10.2)
Chloride: 108 mmol/L (ref 98–110)
GFR, EST NON AFRICAN AMERICAN: 84 mL/min (ref >=60)
GFR, Est African American: >89 mL/min (ref >=60)
GLUCOSE: 82 mg/dL (ref 65–99)
Potassium: 3.8 mmol/L (ref 3.5–5.3)
SODIUM: 141 mmol/L (ref 135–146)
TOTAL PROTEIN: 6.1 g/dL (ref 6.1–8.1)

## 2016-02-09 NOTE — Progress Notes (Signed)
Call pt: liver, kidney, glucose look great.

## 2016-02-25 ENCOUNTER — Other Ambulatory Visit: Payer: Self-pay | Admitting: Physician Assistant

## 2016-02-25 DIAGNOSIS — N946 Dysmenorrhea, unspecified: Secondary | ICD-10-CM

## 2016-03-09 ENCOUNTER — Other Ambulatory Visit: Payer: Self-pay | Admitting: Physician Assistant

## 2016-03-09 ENCOUNTER — Ambulatory Visit: Payer: Commercial Managed Care - PPO | Admitting: Obstetrics & Gynecology

## 2016-03-09 DIAGNOSIS — N946 Dysmenorrhea, unspecified: Secondary | ICD-10-CM

## 2016-03-10 ENCOUNTER — Other Ambulatory Visit: Payer: Self-pay | Admitting: *Deleted

## 2016-03-10 DIAGNOSIS — N946 Dysmenorrhea, unspecified: Secondary | ICD-10-CM

## 2016-03-10 MED ORDER — NORETHINDRONE-ETH ESTRADIOL 1-35 MG-MCG PO TABS: ORAL_TABLET | ORAL | 4 refills | Status: DC

## 2016-03-10 NOTE — Telephone Encounter (Signed)
Pt called requesting a RF on OCP's.  She had appt yesterday but due to inclement weather the office was closed and pt had to reschedule.  [facility].

## 2016-04-10 ENCOUNTER — Encounter: Payer: Self-pay | Admitting: Physician Assistant

## 2016-04-10 ENCOUNTER — Ambulatory Visit (INDEPENDENT_AMBULATORY_CARE_PROVIDER_SITE_OTHER): Payer: Commercial Managed Care - PPO | Admitting: Obstetrics & Gynecology

## 2016-04-10 ENCOUNTER — Encounter: Payer: Self-pay | Admitting: Obstetrics & Gynecology

## 2016-04-10 VITALS — BP 137/97 | HR 85 | Ht 63.0 in | Wt 144.0 lb

## 2016-04-10 DIAGNOSIS — Z0000 Encounter for general adult medical examination without abnormal findings: Secondary | ICD-10-CM | POA: Diagnosis not present

## 2016-04-10 DIAGNOSIS — I10 Essential (primary) hypertension: Secondary | ICD-10-CM | POA: Insufficient documentation

## 2016-04-10 DIAGNOSIS — N946 Dysmenorrhea, unspecified: Secondary | ICD-10-CM

## 2016-04-10 DIAGNOSIS — Z01419 Encounter for gynecological examination (general) (routine) without abnormal findings: Secondary | ICD-10-CM

## 2016-04-10 MED ORDER — NORETHINDRONE-ETH ESTRADIOL 1-35 MG-MCG PO TABS: ORAL_TABLET | ORAL | Status: DC

## 2016-04-10 NOTE — Progress Notes (Signed)
Subjective:     Monica Fry is a 36 y.o. female here for a routine exam.  Current complaints: none.  Doing well on continuous OCPs.   Gynecologic History No LMP recorded (lmp unknown). Patient is not currently having periods (Reason: Oral contraceptives). Contraception: OCP (estrogen/progesterone) Last Pap: 2015. Results were: normal Last mammogram: n/a  Obstetric History OB History  Gravida Para Term Preterm AB Living  2 2 2     2   SAB TAB Ectopic Multiple Live Births          2    # Outcome Date GA Lbr Len/2nd Weight Sex Delivery Anes PTL Lv  2 Term 10/26/09 1929w2d 05:00 7 lb 9 oz (3.43 kg) F Vag-Spont EPI  LIV  1 Term 06/27/07 2429w2d 12:00 7 lb 11 oz (3.487 kg) F Vag-Spont EPI  LIV       The following portions of the patient's history were reviewed and updated as appropriate: allergies, current medications, past family history, past medical history, past social history, past surgical history and problem list.  Review of Systems Pertinent items noted in HPI and remainder of comprehensive ROS otherwise negative.    Objective:      Vitals:   04/10/16 1521  BP: (!) 137/97  Pulse: 85  Weight: 144 lb (65.3 kg)  Height: 5\' 3"  (1.6 m)   Vitals:  WNL General appearance: alert, cooperative and no distress  HEENT: Normocephalic, without obvious abnormality, atraumatic Eyes: negative Throat: lips, mucosa, and tongue normal; teeth and gums normal  Respiratory: Clear to auscultation bilaterally  CV: Regular rate and rhythm  Breasts:  Normal appearance, no masses or tenderness, no nipple retraction or dimpling  GI: Soft, non-tender; bowel sounds normal; no masses,  no organomegaly  GU: External Genitalia:  Tanner V, no lesion Urethra:  No prolapse   Vagina: Pink, normal rugae, no blood or discharge  Cervix: No CMT, no lesion  Uterus:  Normal size and contour, non tender  Adnexa: Normal, no masses, non tender  Musculoskeletal: No edema, redness or tenderness in the calves  or thighs  Skin: No lesions or rash  Lymphatic: Axillary adenopathy: none     Psychiatric: Normal mood and behavior        Assessment:    Healthy female exam.    Plan:    Education reviewed: self breast exams and skin cancer screening. Contraception: OCP (estrogen/progesterone). Follow up in: 1 year. White coat htn--take BP at home for 2 week and call us with results.  This happened last year and was cleared by [place]. breeback.

## 2016-04-14 LAB — CYTOLOGY - PAP
DIAGNOSIS: NEGATIVE
HPV: NOT DETECTED

## 2016-05-16 ENCOUNTER — Encounter: Payer: Self-pay | Admitting: Physician Assistant

## 2016-05-17 ENCOUNTER — Other Ambulatory Visit: Payer: Self-pay | Admitting: Physician Assistant

## 2016-05-17 MED ORDER — PREDNISONE 10 MG (21) PO TBPK: ORAL_TABLET | ORAL | 0 refills | Status: DC

## 2017-02-06 ENCOUNTER — Ambulatory Visit (INDEPENDENT_AMBULATORY_CARE_PROVIDER_SITE_OTHER): Payer: Commercial Managed Care - PPO | Admitting: Physician Assistant

## 2017-02-06 ENCOUNTER — Encounter: Payer: Self-pay | Admitting: Physician Assistant

## 2017-02-06 VITALS — BP 120/78 | HR 93 | Ht 63.0 in | Wt 148.0 lb

## 2017-02-06 DIAGNOSIS — Z131 Encounter for screening for diabetes mellitus: Secondary | ICD-10-CM | POA: Diagnosis not present

## 2017-02-06 DIAGNOSIS — Z13220 Encounter for screening for lipoid disorders: Secondary | ICD-10-CM

## 2017-02-06 DIAGNOSIS — Z0000 Encounter for general adult medical examination without abnormal findings: Secondary | ICD-10-CM | POA: Diagnosis not present

## 2017-02-06 LAB — COMPLETE METABOLIC PANEL WITH GFR
AG Ratio: 1.7 (calc) (ref 1.0–2.5)
ALT: 8 U/L (ref 6–29)
AST: 15 U/L (ref 10–30)
Albumin: 4.1 g/dL (ref 3.6–5.1)
Alkaline phosphatase (APISO): 42 U/L (ref 33–115)
BILIRUBIN TOTAL: 0.5 mg/dL (ref 0.2–1.2)
BUN: 15 mg/dL (ref 7–25)
CALCIUM: 8.9 mg/dL (ref 8.6–10.2)
CHLORIDE: 103 mmol/L (ref 98–110)
CO2: 28 mmol/L (ref 20–32)
Creat: 0.95 mg/dL (ref 0.50–1.10)
GFR, EST AFRICAN AMERICAN: 89 mL/min/{1.73_m2} (ref >=60)
GFR, EST NON AFRICAN AMERICAN: 77 mL/min/{1.73_m2} (ref >=60)
GLOBULIN: 2.4 g/dL (ref 1.9–3.7)
Glucose, Bld: 88 mg/dL (ref 65–99)
Potassium: 3.8 mmol/L (ref 3.5–5.3)
SODIUM: 137 mmol/L (ref 135–146)
Total Protein: 6.5 g/dL (ref 6.1–8.1)

## 2017-02-06 LAB — LIPID PANEL W/REFLEX DIRECT LDL
CHOL/HDL RATIO: 3.7 (calc) (ref <5.0)
Cholesterol: 202 mg/dL — ABNORMAL HIGH (ref <200)
HDL: 55 mg/dL (ref >50)
LDL Cholesterol (Calc): 118 mg/dL (calc) — ABNORMAL HIGH
NON-HDL CHOLESTEROL (CALC): 147 mg/dL — AB (ref <130)
Triglycerides: 171 mg/dL — ABNORMAL HIGH (ref <150)

## 2017-02-06 NOTE — Patient Instructions (Signed)

## 2017-02-06 NOTE — Progress Notes (Addendum)
Subjective:     Monica Fry is a 36 y.o. female and is here for a comprehensive physical exam. The patient reports problems - patient reports continued mild itching that has improved since her last visit and occasional migraines. She was seen by both a dermatologist and allergist and found no food allergies or specific triggers. She was put on trials of Claritin, Allegra, Flovent, Singular and prednisone. She now takes Zyrtec at night. She only gets occasional migraines with weather changes and uses Imitrex as needed for acute relief. She sees Dr. Penne LashLeggett for her womens health and pap. She exercises occasionally.  Social History   Socioeconomic History  . Marital status: Married    Spouse name: Not on file  . Number of children: Not on file  . Years of education: Not on file  . Highest education level: Not on file  Social Needs  . Financial resource strain: Not on file  . Food insecurity - worry: Not on file  . Food insecurity - inability: Not on file  . Transportation needs - medical: Not on file  . Transportation needs - non-medical: Not on file  Occupational History  . Occupation: Printmakerpharmacist    Employer: [employer]  Tobacco Use  . Smoking status: Never Smoker  . Smokeless tobacco: Never Used  Substance and Sexual Activity  . Alcohol use: Yes    Comment: very socially  . Drug use: No  . Sexual activity: Yes    Partners: Male    Birth control/protection: Pill  Other Topics Concern  . Not on file  Social History Narrative  . Not on file   Health Maintenance  Topic Date Due  . HIV Screening  09/07/1994  . INFLUENZA VACCINE  09/20/2016  . PAP SMEAR  04/11/2019  . TETANUS/TDAP  11/15/2023    The following portions of the patient's history were reviewed and updated as appropriate: allergies, current medications, past family history, past medical history, past social history, past surgical history and problem list.  Review of Systems Pertinent items noted in HPI  and remainder of comprehensive ROS otherwise negative.   Objective:    BP 120/78   Pulse 93   Ht 5\' 3"  (1.6 m)   Wt 148 lb (67.1 kg)   BMI 26.22 kg/m  General appearance: alert, cooperative and no distress Head: Normocephalic, without obvious abnormality, atraumatic Eyes: conjunctivae/corneas clear. PERRL, EOM's intact. Fundi benign. Ears: normal TM's and external ear canals both ears Nose: Nares normal. Septum midline. Mucosa normal. No drainage or sinus tenderness. Throat: lips, mucosa, and tongue normal; teeth and gums normal Neck: no adenopathy, no carotid bruit, no JVD, supple, symmetrical, trachea midline and thyroid not enlarged, symmetric, no tenderness/mass/nodules Back: symmetric, no curvature. ROM normal. No CVA tenderness. Lungs: clear to auscultation bilaterally Heart: regular rate and rhythm, S1, S2 normal, no murmur, click, rub or gallop Abdomen: soft, non-tender; bowel sounds normal; no masses,  no organomegaly Extremities: extremities normal, atraumatic, no cyanosis or edema Pulses: 2+ and symmetric Skin: Skin color, texture, turgor normal. No rashes or lesions Lymph nodes: Cervical, supraclavicular, and axillary nodes normal. Neurologic: Alert and oriented X 3, normal strength and tone. Normal symmetric reflexes. Normal coordination and gait    Assessment:    Healthy female exam.     Plan:  Marland Kitchen.Marland Kitchen.[name] was seen today for annual exam.  Diagnoses and all orders for this visit:  Routine physical examination -     Lipid Panel w/reflex Direct LDL -  COMPLETE METABOLIC PANEL WITH GFR  Screening for lipid disorders -     Lipid Panel w/reflex Direct LDL  Screening for diabetes mellitus -     COMPLETE METABOLIC PANEL WITH GFR     Routine physical and screening - Routine labs today - fasting lipids and CMP. Patient sees Dr. Penne LashLeggett for her pap and pelvic exams. Encouraged vitamin D and calcium supplements or ensuring adequate amounts in diet. Encouraged  exercise 150 minutes per week. -vaccines up to date. Flu shot given today.   See After Visit Summary for Counseling Recommendations

## 2017-02-25 ENCOUNTER — Other Ambulatory Visit: Payer: Self-pay | Admitting: Obstetrics & Gynecology

## 2017-02-25 DIAGNOSIS — N946 Dysmenorrhea, unspecified: Secondary | ICD-10-CM

## 2017-04-18 ENCOUNTER — Ambulatory Visit (INDEPENDENT_AMBULATORY_CARE_PROVIDER_SITE_OTHER): Payer: Commercial Managed Care - PPO | Admitting: Obstetrics & Gynecology

## 2017-04-18 ENCOUNTER — Encounter: Payer: Self-pay | Admitting: Obstetrics & Gynecology

## 2017-04-18 DIAGNOSIS — N946 Dysmenorrhea, unspecified: Secondary | ICD-10-CM

## 2017-04-18 DIAGNOSIS — Z01419 Encounter for gynecological examination (general) (routine) without abnormal findings: Secondary | ICD-10-CM | POA: Diagnosis not present

## 2017-04-18 MED ORDER — NORETHINDRONE-ETH ESTRADIOL 1-35 MG-MCG PO TABS: ORAL_TABLET | ORAL | 15 refills | Status: DC

## 2017-04-18 NOTE — Progress Notes (Signed)
Subjective:     Monica Fry is a 37 y.o. female here for a routine exam.  Current complaints: doing well, on continuous OCPs.  Personal health questionnaire reviewed: yes.   Gynecologic History No LMP recorded (lmp unknown). Patient is not currently having periods (Reason: Oral contraceptives). Contraception: OCP (estrogen/progesterone) and vasectomy Last Pap: 2018. Results were: normal Last mammogram: n/a.   Obstetric History OB History  Gravida Para Term Preterm AB Living  2 2 2     2   SAB TAB Ectopic Multiple Live Births          2    # Outcome Date GA Lbr Len/2nd Weight Sex Delivery Anes PTL Lv  2 Term 10/26/09 6048w2d 05:00 7 lb 9 oz (3.43 kg) F Vag-Spont EPI  LIV  1 Term 06/27/07 2848w2d 12:00 7 lb 11 oz (3.487 kg) F Vag-Spont EPI  LIV       The following portions of the patient's history were reviewed and updated as appropriate: allergies, current medications, past family history, past medical history, past social history, past surgical history and problem list.  Review of Systems Pertinent items noted in HPI and remainder of comprehensive ROS otherwise negative.    Objective:      Vitals:   04/18/17 0821  BP: (!) 131/92  Pulse: 93  Weight: 144 lb (65.3 kg)  Height: 5\' 2"  (1.575 m)   Vitals:  WNL General appearance: alert, cooperative and no distress  HEENT: Normocephalic, without obvious abnormality, atraumatic Eyes: negative Throat: lips, mucosa, and tongue normal; teeth and gums normal  Respiratory: Clear to auscultation bilaterally  CV: Regular rate and rhythm  Breasts:  Normal appearance, no masses or tenderness, no nipple retraction or dimpling  GI: Soft, non-tender; bowel sounds normal; no masses,  no organomegaly  GU: External Genitalia:  Tanner V, no lesion Urethra:  No prolapse   Vagina: Pink, normal rugae, no blood or discharge  Cervix: No CMT, ectropion present--no bleeding with exam or intercourse.  Uterus:  Normal size and contour, non tender   Adnexa: Normal, no masses, non tender  Musculoskeletal: No edema, redness or tenderness in the calves or thighs  Skin: Nevus on back--needs to serm.  Lymphatic: Axillary adenopathy: none     Psychiatric: Normal mood and behavior        Assessment:    Healthy female exam.   White coat hypertension   Plan:    Pap due 2021 earliest Diet controlling incread TG. Continuous OCPs for endometriosis pain.

## 2017-05-18 ENCOUNTER — Other Ambulatory Visit: Payer: Self-pay | Admitting: Obstetrics & Gynecology

## 2017-05-18 DIAGNOSIS — N946 Dysmenorrhea, unspecified: Secondary | ICD-10-CM

## 2017-05-24 ENCOUNTER — Encounter: Payer: Self-pay | Admitting: Physician Assistant

## 2017-05-25 MED ORDER — SUMATRIPTAN SUCCINATE 100 MG PO TABS: 100.0000 mg | ORAL_TABLET | ORAL | 5 refills | Status: DC | PRN

## 2017-07-06 ENCOUNTER — Other Ambulatory Visit: Payer: Self-pay | Admitting: Physician Assistant

## 2017-07-06 ENCOUNTER — Encounter: Payer: Self-pay | Admitting: Physician Assistant

## 2017-07-06 DIAGNOSIS — R51 Headache: Secondary | ICD-10-CM

## 2017-07-06 DIAGNOSIS — J301 Allergic rhinitis due to pollen: Secondary | ICD-10-CM

## 2017-07-06 DIAGNOSIS — R519 Headache, unspecified: Secondary | ICD-10-CM

## 2017-11-21 ENCOUNTER — Encounter: Payer: Self-pay | Admitting: Physician Assistant

## 2017-11-21 MED ORDER — CYCLOBENZAPRINE HCL 10 MG PO TABS: 10.0000 mg | ORAL_TABLET | Freq: Three times a day (TID) | ORAL | 1 refills | Status: DC | PRN

## 2018-01-16 ENCOUNTER — Encounter: Payer: Self-pay | Admitting: Physician Assistant

## 2018-02-08 ENCOUNTER — Encounter: Payer: Self-pay | Admitting: Physician Assistant

## 2018-02-08 ENCOUNTER — Ambulatory Visit (INDEPENDENT_AMBULATORY_CARE_PROVIDER_SITE_OTHER): Payer: Commercial Managed Care - PPO | Admitting: Physician Assistant

## 2018-02-08 VITALS — BP 128/78 | HR 94 | Ht 62.0 in | Wt 140.0 lb

## 2018-02-08 DIAGNOSIS — G43001 Migraine without aura, not intractable, with status migrainosus: Secondary | ICD-10-CM

## 2018-02-08 DIAGNOSIS — G43101 Migraine with aura, not intractable, with status migrainosus: Secondary | ICD-10-CM | POA: Insufficient documentation

## 2018-02-08 DIAGNOSIS — Z131 Encounter for screening for diabetes mellitus: Secondary | ICD-10-CM | POA: Diagnosis not present

## 2018-02-08 DIAGNOSIS — Z0000 Encounter for general adult medical examination without abnormal findings: Secondary | ICD-10-CM | POA: Diagnosis not present

## 2018-02-08 MED ORDER — NARATRIPTAN HCL 2.5 MG PO TABS: 2.5000 mg | ORAL_TABLET | Freq: Every day | ORAL | 6 refills | Status: DC

## 2018-02-08 MED ORDER — ONDANSETRON HCL 4 MG PO TABS: 4.0000 mg | ORAL_TABLET | Freq: Three times a day (TID) | ORAL | 0 refills | Status: DC | PRN

## 2018-02-08 MED ORDER — CYCLOBENZAPRINE HCL 10 MG PO TABS: 10.0000 mg | ORAL_TABLET | Freq: Three times a day (TID) | ORAL | 1 refills | Status: DC | PRN

## 2018-02-08 NOTE — Patient Instructions (Signed)

## 2018-02-08 NOTE — Progress Notes (Signed)
Subjective:     Monica Fry is a 37 y.o. female and is here for a comprehensive physical exam. The patient reports no problems.  Migraines doing much better. Stopped Imitrex and using naratriptan. Much more tolerated. Averaging 1-2 migraines per month.    Social History   Socioeconomic History  . Marital status: Married    Spouse name: Not on file  . Number of children: Not on file  . Years of education: Not on file  . Highest education level: Not on file  Occupational History  . Occupation: Printmakerpharmacist    Employer: [employer]  Social Needs  . Financial resource strain: Not on file  . Food insecurity:    Worry: Not on file    Inability: Not on file  . Transportation needs:    Medical: Not on file    Non-medical: Not on file  Tobacco Use  . Smoking status: Never Smoker  . Smokeless tobacco: Never Used  Substance and Sexual Activity  . Alcohol use: Yes    Comment: very socially  . Drug use: No  . Sexual activity: Yes    Partners: Male    Birth control/protection: Pill  Lifestyle  . Physical activity:    Days per week: Not on file    Minutes per session: Not on file  . Stress: Not on file  Relationships  . Social connections:    Talks on phone: Not on file    Gets together: Not on file    Attends religious service: Not on file    Active member of club or organization: Not on file    Attends meetings of clubs or organizations: Not on file    Relationship status: Not on file  . Intimate partner violence:    Fear of current or ex partner: Not on file    Emotionally abused: Not on file    Physically abused: Not on file    Forced sexual activity: Not on file  Other Topics Concern  . Not on file  Social History Narrative  . Not on file   Health Maintenance  Topic Date Due  . HIV Screening  02/09/2019 (Originally 09/07/1994)  . PAP SMEAR-Modifier  04/11/2019  . TETANUS/TDAP  11/15/2023  . INFLUENZA VACCINE  Completed    The following portions of the  patient's history were reviewed and updated as appropriate: allergies, current medications, past family history, past medical history, past social history, past surgical history and problem list.  Review of Systems Pertinent items noted in HPI and remainder of comprehensive ROS otherwise negative.   Objective:    BP 128/78   Pulse 94   Ht 5\' 2"  (1.575 m)   Wt 140 lb (63.5 kg)   BMI 25.61 kg/m  General appearance: alert, cooperative and appears stated age Head: Normocephalic, without obvious abnormality, atraumatic Eyes: conjunctivae/corneas clear. PERRL, EOM's intact. Fundi benign. Ears: normal TM's and external ear canals both ears Nose: Nares normal. Septum midline. Mucosa normal. No drainage or sinus tenderness. Throat: lips, mucosa, and tongue normal; teeth and gums normal Neck: no adenopathy, no carotid bruit, no JVD, supple, symmetrical, trachea midline and thyroid not enlarged, symmetric, no tenderness/mass/nodules Back: symmetric, no curvature. ROM normal. No CVA tenderness. Lungs: clear to auscultation bilaterally Heart: regular rate and rhythm murmur heard. Abdomen: soft, non-tender; bowel sounds normal; no masses,  no organomegaly Extremities: extremities normal, atraumatic, no cyanosis or edema Pulses: 2+ and symmetric Skin: Skin color, texture, turgor normal. No rashes or lesions Lymph nodes:  Cervical, supraclavicular, and axillary nodes normal. Neurologic: Alert and oriented X 3, normal strength and tone. Normal symmetric reflexes. Normal coordination and gait    Assessment:    Healthy female exam.      Plan:  Marland Kitchen.Marland Kitchen.Monica Fry was seen today for annual exam.  Diagnoses and all orders for this visit:  Routine physical examination -     COMPLETE METABOLIC PANEL WITH GFR  Screening for diabetes mellitus -     COMPLETE METABOLIC PANEL WITH GFR  Migraine without aura and with status migrainosus, not intractable -     ondansetron (ZOFRAN) 4 MG tablet; Take 1 tablet (4  mg total) by mouth every 8 (eight) hours as needed for nausea or vomiting. -     cyclobenzaprine (FLEXERIL) 10 MG tablet; Take 1 tablet (10 mg total) by mouth 3 (three) times daily as needed for muscle spasms. -     naratriptan (AMERGE) 2.5 MG tablet; Take 1 tablet (2.5 mg total) by mouth daily.   .. [facility] 02/08/2018 02/06/2017  Decreased Interest 0 0  Down, Depressed, Hopeless 0 0  PHQ - 2 Score 0 0  Altered sleeping 1 -  Tired, decreased energy 1 -  Change in appetite 0 -  Feeling bad or failure about yourself  0 -  Trouble concentrating 0 -  Moving slowly or fidgety/restless 0 -  Suicidal thoughts 0 -  PHQ-9 Score 2 -  Difficult doing work/chores Not difficult at all -   .. Discussed 150 minutes of exercise a week.  Encouraged vitamin D 1000 units and Calcium 1300mg  or 4 servings of dairy a day.  Labs ordered.  Pap up to date.   Migraines controlled.    See After Visit Summary for Counseling Recommendations

## 2018-04-03 ENCOUNTER — Ambulatory Visit (INDEPENDENT_AMBULATORY_CARE_PROVIDER_SITE_OTHER): Payer: Commercial Managed Care - PPO | Admitting: Family Medicine

## 2018-04-03 VITALS — BP 117/80 | HR 91 | Ht 62.0 in | Wt 142.0 lb

## 2018-04-03 DIAGNOSIS — M25511 Pain in right shoulder: Secondary | ICD-10-CM

## 2018-04-03 NOTE — Progress Notes (Signed)
Monica Fry is a 38 y.o. female who presents to [facility] today for right shoulder pain.  Monica Fry developed pain in the right lateral upper arm over the last 2 months.  She cannot recall any specific injury.  Pain is worse with reaching back and push-up type activities.  She is physically active and exercises doing body weight activities such as planks push-ups yoga and Pilates type exercises.  She denies any radiating pain weakness or numbness distally.  She denies any recent injury.  She does note a history of injuring her shoulder in high school that was never really evaluated fully.  She denies locking or catching or giving way.  She has a history of contralateral left shoulder rotator cuff tendinopathy that was well treated with injection.  She notes her pain is not entirely consistent with her left-sided shoulder pain in the past.   Her current pain is moderate and does interfere with her ability to exercise normally and complete her activities at home such as cooking and cleaning at times.    ROS:  As above  Exam:  BP 117/80   Pulse 91   Ht 5\' 2"  (1.575 m)   Wt 142 lb (64.4 kg)   BMI 25.97 kg/m  Wt Readings from Last 5 Encounters:  04/03/18 142 lb (64.4 kg)  02/08/18 140 lb (63.5 kg)  04/18/17 144 lb (65.3 kg)  02/06/17 148 lb (67.1 kg)  04/10/16 144 lb (65.3 kg)   General: Well Developed, well nourished, and in no acute distress.  Neuro/Psych: Alert and oriented x3, extra-ocular muscles intact, able to move all 4 extremities, sensation grossly intact. Skin: Warm and dry, no rashes noted.  Respiratory: Not using accessory muscles, speaking in full sentences, trachea midline.  Cardiovascular: Pulses palpable, no extremity edema. Abdomen: Does not appear distended. MSK:  C-spine: Nontender to spinal midline normal cervical motion. Right shoulder normal-appearing nontender. Normal adduction.  Pain with abduction external  rotation positioning. Normal external rotation. Limited internal rotation to lumbar spine.  Strength is 4+/5 abduction.  5/5 external and internal rotation strength.  Positive Hawkins and Neer's test. Negative crossover arm compression test. Positive O'Brien's test. Negative anterior and posterior apprehension sign. Negative Yergason's test.  Strength is intact and pain-free with resisted elbow flexion and extension.  Contralateral left shoulder normal-appearing normal motion nontender normal strength negative impingement testing.     Lab and Radiology Results Limited musculoskeletal ultrasound right shoulder Normal-appearing biceps tendon. Normal-appearing subscapularis tendon. Supraspinatus tendon with thickened subacromial bursa.  Slight hypoechoic change in mid tendon not visualized with multiple views or with Doppler activity.  Slight cortical defect apparent at the insertion of the supraspinatus tendon.  Again no Doppler activity in this region. No tendon gap or defect seen with dynamic views or impingement views. Normal-appearing infraspinatus tendon AC joint with mild effusion. Bony structures otherwise normal. Impression: Subacromial bursitis.  Possible subtle articular surface rotator cuff tear supraspinatus tendon however appearance on ultrasound does not meet diagnostic criteria   Procedure: Real-time Ultrasound Guided Injection of right subacromial bursa Device: GE Logiq E   Images permanently stored and available for review in the ultrasound unit. Verbal informed consent obtained.  Discussed risks and benefits of procedure. Warned about infection bleeding damage to structures skin hypopigmentation and fat atrophy among others. Patient expresses understanding and agreement Time-out conducted.   Noted no overlying erythema, induration, or other signs of local infection.   Skin prepped in a sterile fashion.  Local anesthesia: Topical Ethyl chloride.   With sterile  technique and under real time ultrasound guidance:  40 mg of Kenalog and 2 mL of Marcaine injected easily.   Completed without difficulty   Pain immediately resolved suggesting accurate placement of the medication.   Advised to call if fevers/chills, erythema, induration, drainage, or persistent bleeding.   Images permanently stored and available for review in the ultrasound unit.  Impression: Technically successful ultrasound guided injection.         Assessment and Plan: 38 y.o. female with  Right shoulder pain.  Ongoing now for for 2 months.  Pain is interfering with activity including exercise.  Physical exam and symptoms are largely consistent with rotator cuff tendinopathy.  Ultrasound is consistent with subacromial bursitis.  Patient had partial diagnostic criteria for partial rotator cuff tear of the supraspinatus tendon however I was unable to verify the tear on transverse or Doppler scans.   Plan for physical therapy and injection as above.  Recheck in about 6 weeks.  If not improving neck step would likely be MRI or MRI arthrogram.    Orders Placed This Encounter  Procedures  . Ambulatory referral to Physical Therapy    Referral Priority:   Routine    Referral Type:   Physical Medicine    Referral Reason:   Specialty Services Required    Requested Specialty:   Physical Therapy   No orders of the defined types were placed in this encounter.   Historical information moved to improve visibility of documentation.  Past Medical History:  Diagnosis Date  . Abnormal Pap smear of cervix 2010  . Anemia    Past Surgical History:  Procedure Laterality Date  . CRYOTHERAPY    . LAPAROSCOPY  2004   exploratory for endometriosis   Social History   Tobacco Use  . Smoking status: Never Smoker  . Smokeless tobacco: Never Used  Substance Use Topics  . Alcohol use: Yes    Comment: very socially   family history includes Breast cancer in her maternal grandmother; Cancer in  her paternal grandmother; Depression in her maternal grandmother; Diabetes in her father; Heart attack in her paternal grandfather; Heart disease in her father; Hyperlipidemia in her father, maternal grandmother, and paternal grandfather; Hypertension in her father, maternal grandmother, and paternal grandfather; Thrombosis in her maternal grandmother.  Medications: Current Outpatient Medications  Medication Sig Dispense Refill  . cetirizine (ZYRTEC) 10 MG tablet Take 10 mg by mouth daily.    . cyclobenzaprine (FLEXERIL) 10 MG tablet Take 1 tablet (10 mg total) by mouth 3 (three) times daily as needed for muscle spasms. 30 tablet 1  . fluticasone (FLONASE) 50 MCG/ACT nasal spray Place 2 sprays into the nose as needed. Reported on 07/15/2015    . ibuprofen (ADVIL,MOTRIN) 200 MG tablet Take 200 mg by mouth as needed.     . Multiple Vitamin (MULTIVITAMIN) tablet Take 1 tablet by mouth daily.      . naratriptan (AMERGE) 2.5 MG tablet Take 1 tablet (2.5 mg total) by mouth daily. 10 tablet 6  . norethindrone-ethinyl estradiol 1/35 (ALAYCEN 1/35) tablet 1 ACTIVE PILL CONTINUOUSLY 28 tablet 15  . ondansetron (ZOFRAN) 4 MG tablet Take 1 tablet (4 mg total) by mouth every 8 (eight) hours as needed for nausea or vomiting. 20 tablet 0   No current facility-administered medications for this visit.    No Known Allergies    Discussed warning signs or symptoms. Please see discharge instructions. Patient expresses understanding.

## 2018-04-03 NOTE — Patient Instructions (Signed)
Thank you for coming in today.  Attend PT.  Work on home exercises with the band.   Recheck in 6 weeks.  Return sooner if needed.   [employer] Helix does have a shoulder sleeve.

## 2018-04-10 ENCOUNTER — Other Ambulatory Visit: Payer: Self-pay

## 2018-04-10 ENCOUNTER — Encounter: Payer: Self-pay | Admitting: Physical Therapy

## 2018-04-10 ENCOUNTER — Ambulatory Visit (INDEPENDENT_AMBULATORY_CARE_PROVIDER_SITE_OTHER): Payer: Commercial Managed Care - PPO | Admitting: Physical Therapy

## 2018-04-10 DIAGNOSIS — M25511 Pain in right shoulder: Secondary | ICD-10-CM

## 2018-04-10 DIAGNOSIS — R293 Abnormal posture: Secondary | ICD-10-CM | POA: Diagnosis not present

## 2018-04-10 NOTE — Therapy (Signed)
[facility] [place] [address] [place], [place], [address] Phone: [phone]   Fax:  [phone]  Physical Therapy Evaluation  Patient Details  Name: Monica Fry MRN: [id number] Date of Birth: 1980-10-19 Referring Provider (PT): Rodolph Bong, MD   Encounter Date: 04/10/2018  PT End of Session - 04/10/18 0854    Visit Number  1    Number of Visits  12    Date for PT Re-Evaluation  05/22/18    PT Start Time  0715    PT Stop Time  0758    PT Time Calculation (min)  43 min    Activity Tolerance  Patient tolerated treatment well    Behavior During Therapy  [facility] for tasks assessed/performed       Past Medical History:  Diagnosis Date  . Abnormal Pap smear of cervix 2010  . Anemia     Past Surgical History:  Procedure Laterality Date  . CRYOTHERAPY    . LAPAROSCOPY  2004   exploratory for endometriosis    There were no vitals filed for this visit.   Subjective Assessment - 04/10/18 0715    Subjective  Pt is a 38 y/o female who presents to OPPT for Rt shoulder pain, which began when playing air hockey around Christmas.  Pt states pain initially with external rotation and abduction; and pain in lateral upper arm.  Pt with difficulty with weight bearing through Rt arm (side planks).    Diagnostic tests  U/S: possible slight tear of rotator cuff    Patient Stated Goals  improve the pain    Currently in Pain?  Yes    Pain Score  0-No pain   up to 4/10   Pain Location  Shoulder    Pain Orientation  Right;Lateral    Pain Descriptors / Indicators  Sore    Pain Type  Acute pain    Pain Onset  More than a month ago    Pain Frequency  Intermittent    Aggravating Factors   external rotation with abduction, pushing against door (during doorway stretch)    Pain Relieving Factors  injection, rest, ice         [facility] PT Assessment - 04/10/18 0719      Assessment   Medical Diagnosis  M25.511 (ICD-10-CM) - Acute pain of  right shoulder    Referring Provider (PT)  Rodolph Bong, MD    Onset Date/Surgical Date  --   around Christmas   Hand Dominance  Right    Next MD Visit  05/22/2018    Prior Therapy  at this clinic 2-3 yrs ago for shoulder pain      Precautions   Precautions  None      Restrictions   Weight Bearing Restrictions  No      Balance Screen   Has the patient fallen in the past 6 months  No    Has the patient had a decrease in activity level because of a fear of falling?   No    Is the patient reluctant to leave their home because of a fear of falling?   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; seated/typing at desk all day    Leisure  travel, read; regular exercise 5x/wk (treadmill/yoga/pilates)      Observation/Other Assessments   Focus on Therapeutic Outcomes (FOTO)   66 (34% limited; predicted 24% limited)      Posture/Postural Control   Posture/Postural Control  Postural limitations    Postural Limitations  Rounded Shoulders;Forward head      ROM / Strength   AROM / PROM / Strength  AROM;Strength      AROM   Overall AROM Comments  Rt shoulder WNL; mild limitation with functional internal rotation to L1/2      Strength   Strength Assessment Site  Shoulder;Elbow;Hand    Right/Left Shoulder  Right;Left    Right Shoulder Flexion  5/5    Right Shoulder ABduction  3+/5    Right Shoulder Internal Rotation  5/5    Right Shoulder External Rotation  4/5    Left Shoulder Flexion  5/5    Left Shoulder ABduction  5/5    Left Shoulder Internal Rotation  5/5    Left Shoulder External Rotation  5/5    Right/Left Elbow  Right;Left    Right Elbow Flexion  5/5    Right Elbow Extension  5/5    Left Elbow  Flexion  5/5    Left Elbow Extension  5/5    Right/Left hand  Right;Left    Right Hand Grip (lbs)  58.33   58, 61, 56   Left Hand Grip (lbs)  46.67   55, 40, 45     Palpation   Palpation comment  trigger points in Rt middle deltoid/biceps                Objective measurements completed on examination: See above findings.      [facility]/Exercise - 04/10/18 0719      Exercises   Exercises  Shoulder      Shoulder Exercises: Standing   External Rotation Limitations  verbally reviewed    ABduction  Right;5 reps;Theraband    Theraband Level (Shoulder ABduction)  Level 1 (Yellow)    ABduction Limitations  scaption    Row Limitations  verbally reviewed      Manual Therapy   Manual Therapy  Soft tissue mobilization    Manual therapy comments  skilled palpation and monitoring of soft tissue during DN    Soft tissue mobilization  Rt lateral/anterior deltoid into biceps       Trigger Point Dry Needling - 04/10/18 0853    Consent Given?  Yes    Education Handout Provided  Yes    Muscles Treated Upper Body  --   biceps, middle, anterior deltoid: twitch responses          PT Education - 04/10/18 0854    Education Details  HEP, DN    Person(s) Educated  Patient    Methods  Explanation;Demonstration;Handout    Comprehension  Verbalized understanding;Returned demonstration;Need further instruction          PT Long Term Goals - 04/10/18 0909      PT LONG TERM GOAL #1   Title  independent with HEP    Status  New    Target Date  05/22/18      PT LONG TERM GOAL #2   Title  report pain < 2/10 after full work day for improved function and mobility    Status  New    Target Date  05/22/18      PT LONG TERM GOAL #3   Title  FOTO score improved to </= 24% limited for improved function    Status  New    Target Date  05/22/18      PT LONG TERM GOAL #4   Title  demonstrate at least 4/5 Rt shoulder abduction strength for improved function     Status  New    Target Date  05/22/18             Plan - 04/10/18 0757    Clinical Impression Statement  Pt is a 38 y/o female who presents to OPPT for Rt shoulder pain due to overuse from playing air hockey.  Pt demonstrates decreased strength, mild postural abnormalities, and active trigger points affecting functional mobility.  Pt will benefit from PT to address deficits listed.    Clinical Presentation  Stable    Clinical Decision Making  Low    Rehab Potential  Good    PT Frequency  2x / week   1-2x/wk   PT Duration  6 weeks    PT Treatment/Interventions  ADLs/Self Care Home Management;Cryotherapy;Ultrasound;Moist Heat;Iontophoresis 4mg /ml Dexamethasone;Electrical Stimulation;Functional mobility training;Neuromuscular re-education;Therapeutic exercise;Therapeutic activities;Patient/family education;Manual techniques;Dry needling;Vasopneumatic Device;Taping    PT Next Visit Plan  review HEP and assess response to DN, continue manual/modalities/DN; gentle strengthening exercises    PT Home Exercise Plan  Access Code: [email or web address]    Consulted and Agree with Plan of Care  Patient       Patient will benefit from skilled therapeutic intervention in order to improve the following deficits and impairments:  Increased fascial restricitons, Increased muscle spasms, Pain, Postural dysfunction, Decreased strength, Impaired UE functional use  Visit Diagnosis: Acute pain of right shoulder - Plan: PT plan of care cert/re-cert  Abnormal posture - Plan: PT plan of care cert/re-cert     Problem List Patient Active Problem List   Diagnosis Date Noted  . Migraine without aura and with status migrainosus, not intractable 02/08/2018  . White coat syndrome with hypertension 04/10/2016  . Heart murmur on physical examination 02/07/2016  . 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 Bursitis of left shoulder 07/15/2015  . History of cervical dysplasia 01/12/2015  . Oral contraceptive use 01/08/2014  . H/O  motion sickness 11/14/2013  . Closed fracture of fifth metatarsal bone of left foot 04/28/2013  . Allergic rhinitis 11/08/2012  . Seasonal allergies 11/08/2012  . Dysmenorrhea 10/30/2012      Clarita Crane, PT, DPT 04/10/18 9:13 AM    [facility] [address] [place], [place], [address] Phone: [phone]   Fax:  [phone]  Name: Monica Fry MRN: [id number] Date of Birth: 1980-04-20

## 2018-04-10 NOTE — Patient Instructions (Signed)
Access Code: [email or web address]  URL: [email or web address]  Date: 04/10/2018  Prepared by: Moshe Cipro   Exercises  Standing Single Arm Shoulder Abduction with Resistance - 10 reps - 3 sets - 1x daily - 7x weekly  Standing Shoulder External Rotation with Resistance - 10 reps - 1 sets - 1-2 sec hold - 1x daily - 7x weekly  Standing Row with Anchored Resistance - 10 reps - 3 sets - 1x daily - 7x weekly  Patient Education  Trigger Point Dry Needling

## 2018-04-17 ENCOUNTER — Encounter: Payer: Self-pay | Admitting: Rehabilitative and Restorative Service Providers"

## 2018-04-17 ENCOUNTER — Ambulatory Visit (INDEPENDENT_AMBULATORY_CARE_PROVIDER_SITE_OTHER): Payer: Commercial Managed Care - PPO | Admitting: Rehabilitative and Restorative Service Providers"

## 2018-04-17 DIAGNOSIS — M25511 Pain in right shoulder: Secondary | ICD-10-CM | POA: Diagnosis not present

## 2018-04-17 DIAGNOSIS — R293 Abnormal posture: Secondary | ICD-10-CM | POA: Diagnosis not present

## 2018-04-17 NOTE — Patient Instructions (Signed)
Access Code: [email or web address]  URL: [email or web address]  Date: 04/17/2018  Prepared by: Corlis Leak   Exercises  Standing Single Arm Shoulder Abduction with Resistance - 10 reps - 3 sets - 1x daily - 7x weekly  Standing Shoulder External Rotation with Resistance - 10 reps - 1 sets - 1-2 sec hold - 1x daily - 7x weekly  Standing Row with Anchored Resistance - 10 reps - 3 sets - 1x daily - 7x weekly   Today  Doorway Pec Stretch at 60 Degrees Abduction - 3 reps - 1 sets - 3x daily - 7x weekly  Doorway Pec Stretch at 90 Degrees Abduction - 3 reps - 1 sets - 30 seconds hold - 3x daily - 7x weekly  Doorway Pec Stretch at 120 Degrees Abduction - 3 reps - 1 sets - 30 second hold hold - 3x daily - 7x weekly  Patient Education  Trigger Point Dry Needling

## 2018-04-17 NOTE — Therapy (Signed)
[facility] [place] [address] [place], [place], [address] Phone: [phone]   Fax:  [phone]  Physical Therapy Treatment  Patient Details  Name: Monica Fry MRN: [id number] Date of Birth: 1980/05/17 Referring Provider (PT): Rodolph Bong, MD   Encounter Date: 04/17/2018  PT End of Session - 04/17/18 1605    Visit Number  2    Number of Visits  12    Date for PT Re-Evaluation  05/22/18    PT Start Time  1602    PT Stop Time  1654    PT Time Calculation (min)  52 min    Activity Tolerance  Patient tolerated treatment well       Past Medical History:  Diagnosis Date  . Abnormal Pap smear of cervix 2010  . Anemia     Past Surgical History:  Procedure Laterality Date  . CRYOTHERAPY    . LAPAROSCOPY  2004   exploratory for endometriosis    There were no vitals filed for this visit.  Subjective Assessment - 04/17/18 1606    Subjective  Doing well - DN was helpful and she has been working on her exercises at home. Plesaed with progress. Has some continued tightness in the shoulder and neck that she thinks would respond well to the DN.     Currently in Pain?  No/denies                       [facility] Adult PT Treatment/Exercise - 04/17/18 0001      Shoulder Exercises: Supine   Other Supine Exercises  snow angel prolonged stretch over noodle arms ~ 90 deg abduction x ~ 2 min       Shoulder Exercises: Standing   ABduction Limitations  HEP     Row  Strengthening;Both;10 reps;Theraband    Theraband Level (Shoulder Row)  Level 2 (Red)    Retraction  Strengthening;Both;10 reps;Theraband    Theraband Level (Shoulder Retraction)  Level 1 (Yellow)    Other Standing Exercises  scap squeeze 10 sec x 10 with noodle       Shoulder Exercises: ROM/Strengthening   UBE (Upper Arm Bike)  L1 x 4 min 2 min fwd/2 min back       Shoulder Exercises: Stretch   Other Shoulder Stretches  doorway stretch 3 positions  30 sec x 2 each position - some discomfort through the anterior Rt shoulder - encouraged pt to stretch only to stretch not pain       Moist Heat Therapy   Number Minutes Moist Heat  10 Minutes    Moist Heat Location  Cervical;Shoulder   thoracic      Manual Therapy   Manual Therapy  Soft tissue mobilization    Manual therapy comments  soft tissu e mobilization through the posterior cervical and upper thoracic paraspinals into upper traps/leveator; skilled palpation and monitoring of soft tissue during DN    Soft tissue mobilization  Rt lateral/anterior deltoid into biceps    Passive ROM  passive cervical stretch into flexion and flexioin with slight rotation        Trigger Point Dry Needling - 04/17/18 1654    Consent Given?  Yes    Muscles Treated Upper Body  Upper trapezius;Longissimus   anterior deltoid; biceps - palpable decrease in tightness    Upper Trapezius Response  Palpable increased muscle length    Longissimus Response  Palpable increased muscle length  PT Education - 04/17/18 1636    Education Details  HEP     Person(s) Educated  Patient    Methods  Explanation;Demonstration;Tactile cues;Verbal cues;Handout    Comprehension  Verbalized understanding;Returned demonstration;Verbal cues required;Tactile cues required          PT Long Term Goals - 04/10/18 0909      PT LONG TERM GOAL #1   Title  independent with HEP    Status  New    Target Date  05/22/18      PT LONG TERM GOAL #2   Title  report pain < 2/10 after full work day for improved function and mobility    Status  New    Target Date  05/22/18      PT LONG TERM GOAL #3   Title  FOTO score improved to </= 24% limited for improved function    Status  New    Target Date  05/22/18      PT LONG TERM GOAL #4   Title  demonstrate at least 4/5 Rt shoulder abduction strength for improved function    Status  New    Target Date  05/22/18            Plan - 04/17/18 1606    Clinical  Impression Statement  Excellent response to Dn last visit with partial resolutiion of arm/shoulder pain. Has some tightness to palpation through the pecs; cervical and thoracic paraspinals; upper trap/leveator Rt > Lt. Good response to DN through the cervical and upper thoracic paraspinals bilat. Added exercises without difficulty. Will benefit from continued PT to address muscular tightness through the cervical and thoracic spine/pecs as well as the deltoid and biceps.     Rehab Potential  Good    PT Frequency  2x / week   1-2x/wk   PT Duration  6 weeks    PT Treatment/Interventions  ADLs/Self Care Home Management;Cryotherapy;Ultrasound;Moist Heat;Iontophoresis 4mg /ml Dexamethasone;Electrical Stimulation;Functional mobility training;Neuromuscular re-education;Therapeutic exercise;Therapeutic activities;Patient/family education;Manual techniques;Dry needling;Vasopneumatic Device;Taping    PT Next Visit Plan  review HEP and assess response to DN, continue manual/modalities/DN; gentle strengthening exercises    PT Home Exercise Plan  Access Code: [email or web address]    Consulted and Agree with Plan of Care  Patient       Patient will benefit from skilled therapeutic intervention in order to improve the following deficits and impairments:  Increased fascial restricitons, Increased muscle spasms, Pain, Postural dysfunction, Decreased strength, Impaired UE functional use  Visit Diagnosis: Acute pain of right shoulder  Abnormal posture     Problem List Patient Active Problem List   Diagnosis Date Noted  . Migraine without aura and with status migrainosus, not intractable 02/08/2018  . White coat syndrome with hypertension 04/10/2016  . Heart murmur on physical examination 02/07/2016  . 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 Bursitis of left shoulder 07/15/2015  . History of cervical dysplasia 01/12/2015  . Oral contraceptive use 01/08/2014  . H/O motion sickness 11/14/2013  . Closed fracture of fifth  metatarsal bone of left foot 04/28/2013  . Allergic rhinitis 11/08/2012  . Seasonal allergies 11/08/2012  . Dysmenorrhea 10/30/2012    Lujain Kraszewski Rober Minion PT, MPH  04/17/2018, 4:59 PM  [facility] [address] [place], [place], [address] Phone: [phone]   Fax:  [phone]  Name: OMIE GALIPEAU MRN: [id number] Date of Birth: 1980/09/06

## 2018-04-18 ENCOUNTER — Encounter: Payer: Commercial Managed Care - PPO | Admitting: Rehabilitative and Restorative Service Providers"

## 2018-04-24 ENCOUNTER — Ambulatory Visit (INDEPENDENT_AMBULATORY_CARE_PROVIDER_SITE_OTHER): Payer: Commercial Managed Care - PPO | Admitting: Physical Therapy

## 2018-04-24 ENCOUNTER — Encounter: Payer: Self-pay | Admitting: Physical Therapy

## 2018-04-24 DIAGNOSIS — R293 Abnormal posture: Secondary | ICD-10-CM | POA: Diagnosis not present

## 2018-04-24 DIAGNOSIS — M25511 Pain in right shoulder: Secondary | ICD-10-CM | POA: Diagnosis not present

## 2018-04-24 NOTE — Therapy (Addendum)
[place], [place], [address] Phone: [phone]   Fax:  [phone]  Physical Therapy Treatment/Discharge Summary  Patient Details  Name: Monica Fry MRN: [id number] Date of Birth: 1980-04-11 Referring Provider (PT): Gregor Hams, MD   Encounter Date: 04/24/2018  PT End of Session - 04/24/18 0758    Visit Number  3    Number of Visits  12    Date for PT Re-Evaluation  05/22/18    PT Start Time  0714    PT Stop Time  0803    PT Time Calculation (min)  49 min    Activity Tolerance  Patient tolerated treatment well       Past Medical History:  Diagnosis Date  . Abnormal Pap smear of cervix 2010  . Anemia     Past Surgical History:  Procedure Laterality Date  . CRYOTHERAPY    . LAPAROSCOPY  2004   exploratory for endometriosis    There were no vitals filed for this visit.  Subjective Assessment - 04/24/18 0716    Subjective  doing much better; no pain since last visit.  has occasional discomfort but no pain    Patient Stated Goals  improve the pain    Currently in Pain?  No/denies         [facility] PT Assessment - 04/24/18 0757      Assessment   Medical Diagnosis  M25.511 (ICD-10-CM) - Acute pain of right shoulder    Referring Provider (PT)  Gregor Hams, MD      Observation/Other Assessments   Focus on Therapeutic Outcomes (FOTO)   72 (28% limited)                   [facility] PT Treatment/Exercise - 04/24/18 0716      Shoulder Exercises: Standing   External Rotation  Both;15 reps;Theraband    Theraband Level (Shoulder External Rotation)  Level 2 (Red)    Row  Both;15 reps;Theraband    Theraband Level (Shoulder Row)  Level 2 (Red)    Other Standing Exercises  scap squeeze 10 sec x 10 with noodle       Shoulder Exercises: ROM/Strengthening   UBE (Upper Arm Bike)  L3 x 4 min 2 min fwd/2 min back       Shoulder Exercises: Stretch   Other Shoulder Stretches   doorway stretch 3 positions 30 sec x 2 each position - some discomfort through the anterior Rt shoulder - encouraged pt to stretch only to stretch not pain       Moist Heat Therapy   Number Minutes Moist Heat  10 Minutes    Moist Heat Location  Cervical;Shoulder       Trigger Point Dry Needling - 04/24/18 0756    Consent Given?  Yes    Education Handout Provided  Previously provided    Muscles Treated Head and Neck  Upper trapezius    Muscles Treated Upper Quadrant  Deltoid    Upper Trapezius Response  Palpable increased muscle length;Twitch reponse elicited    Deltoid Response  Twitch response elicited;Palpable increased muscle length                PT Long Term Goals - 04/10/18 0909      PT LONG TERM GOAL #1   Title  independent with HEP    Status  New    Target Date  05/22/18      PT LONG  TERM GOAL #2   Title  report pain < 2/10 after full work day for improved function and mobility    Status  New    Target Date  05/22/18      PT LONG TERM GOAL #3   Title  FOTO score improved to </= 24% limited for improved function    Status  New    Target Date  05/22/18      PT LONG TERM GOAL #4   Title  demonstrate at least 4/5 Rt shoulder abduction strength for improved function    Status  New    Target Date  05/22/18            Plan - 04/24/18 0758    Clinical Impression Statement  Pt with positive response to PT to date with reports of no pain since last visit.  Improved symptoms and FOTO score today.  Progressing well towards goals.    Rehab Potential  Good    PT Frequency  2x / week   1-2x/wk   PT Duration  6 weeks    PT Treatment/Interventions  ADLs/Self Care Home Management;Cryotherapy;Ultrasound;Moist Heat;Iontophoresis 41m/ml Dexamethasone;Electrical Stimulation;Functional mobility training;Neuromuscular re-education;Therapeutic exercise;Therapeutic activities;Patient/family education;Manual techniques;Dry needling;Vasopneumatic Device;Taping    PT Next  Visit Plan  review HEP and assess response to DN, continue manual/modalities/DN; gentle strengthening exercises    PT Home Exercise Plan  Access Code: [email or web address]   Consulted and Agree with Plan of Care  Patient       Patient will benefit from skilled therapeutic intervention in order to improve the following deficits and impairments:  Increased fascial restricitons, Increased muscle spasms, Pain, Postural dysfunction, Decreased strength, Impaired UE functional use  Visit Diagnosis: Acute pain of right shoulder  Abnormal posture     Problem List Patient Active Problem List   Diagnosis Date Noted  . Migraine without aura and with status migrainosus, not intractable 02/08/2018  . White coat syndrome with hypertension 04/10/2016  . Heart murmur on physical examination 02/07/2016  . 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 Bursitis of left shoulder 07/15/2015  . History of cervical dysplasia 01/12/2015  . Oral contraceptive use 01/08/2014  . H/O motion sickness 11/14/2013  . Closed fracture of fifth metatarsal bone of left foot 04/28/2013  . Allergic rhinitis 11/08/2012  . Seasonal allergies 11/08/2012  . Dysmenorrhea 10/30/2012      SLaureen Abrahams PT, DPT 04/24/18 8:00 AM     [facility] [address] [address]Phone: [phone]  Fax:  [phone] Name: Monica GILKEYMRN: [id number] of Birth: [birth year]    PHYSICAL THERAPY DISCHARGE SUMMARY  Visits from Start of Care: 3  Current functional level related to goals / functional outcomes: See above   Remaining deficits: See above; pt doing well and canceled last appt - anticipate goals would have been met   Education / Equipment: HEP  Plan: Patient agrees to discharge.  Patient goals were not met. Patient is being discharged due to being pleased with the current functional level.  ?????    SLaureen Abrahams PT,  DPT 06/18/18 1:44 PM  [facility] [place] [address] [place] [id number] [phone](office) [phone](fax)

## 2018-05-01 ENCOUNTER — Encounter: Payer: Commercial Managed Care - PPO | Admitting: Physical Therapy

## 2018-05-07 ENCOUNTER — Ambulatory Visit (INDEPENDENT_AMBULATORY_CARE_PROVIDER_SITE_OTHER): Payer: Commercial Managed Care - PPO | Admitting: Certified Nurse Midwife

## 2018-05-07 ENCOUNTER — Encounter: Payer: Self-pay | Admitting: Certified Nurse Midwife

## 2018-05-07 ENCOUNTER — Other Ambulatory Visit: Payer: Self-pay

## 2018-05-07 VITALS — BP 122/88 | HR 78 | Ht 62.0 in | Wt 138.0 lb

## 2018-05-07 DIAGNOSIS — Z01419 Encounter for gynecological examination (general) (routine) without abnormal findings: Secondary | ICD-10-CM

## 2018-05-07 NOTE — Patient Instructions (Signed)

## 2018-05-07 NOTE — Progress Notes (Signed)
Last pap 04/10/16- negative

## 2018-05-07 NOTE — Progress Notes (Signed)
History:  [name] is a 38 y.o. G2P2002 who presents to clinic today for annual well woman exam.  No complaints.  Recently stopped birth control about 6 months ago that she was taking for painful periods, but states she has not had any further pain with periods since stopping birth control.  The following portions of the patient's history were reviewed and updated as appropriate: allergies, current medications, family history, past medical history, social history, past surgical history and problem list.  Review of Systems:  Review of Systems  Constitutional: Negative.  Negative for chills and fever.  HENT: Negative.  Negative for nosebleeds.   Eyes: Negative.  Negative for blurred vision.  Respiratory: Negative.  Negative for cough and shortness of breath.   Cardiovascular: Negative.  Negative for chest pain and palpitations.  Gastrointestinal: Negative.  Negative for abdominal pain, constipation, diarrhea, heartburn, nausea and vomiting.  Genitourinary: Negative.  Negative for dysuria, flank pain, frequency and urgency.  Musculoskeletal: Negative.  Negative for myalgias and neck pain.  Skin: Negative.   Neurological: Negative.  Negative for dizziness, weakness and headaches.  Endo/Heme/Allergies: Negative.  Does not bruise/bleed easily.  Psychiatric/Behavioral: Negative.       Objective:  Physical Exam BP 122/88   Pulse 78   Ht 5\' 2"  (1.575 m)   Wt 138 lb (62.6 kg)   LMP 05/02/2018   BMI 25.24 kg/m  Physical Exam Constitutional:      Appearance: Normal appearance. She is normal weight.  HENT:     Head: Normocephalic and atraumatic.     Right Ear: Tympanic membrane, ear canal and external ear normal.     Left Ear: Tympanic membrane, ear canal and external ear normal.     Nose: Nose normal.     Mouth/Throat:     Mouth: Mucous membranes are moist.     Pharynx: Oropharynx is clear.  Eyes:     Extraocular Movements: Extraocular movements intact.   Conjunctiva/sclera: Conjunctivae normal.     Pupils: Pupils are equal, round, and reactive to light.  Neck:     Musculoskeletal: Normal range of motion and neck supple.  Cardiovascular:     Rate and Rhythm: Normal rate and regular rhythm.     Pulses: Normal pulses.     Heart sounds: Normal heart sounds.  Pulmonary:     Effort: Pulmonary effort is normal.     Breath sounds: Normal breath sounds.  Abdominal:     General: Abdomen is flat. Bowel sounds are normal.     Palpations: Abdomen is soft.  Musculoskeletal: Normal range of motion.  Skin:    General: Skin is warm and dry.     Capillary Refill: Capillary refill takes less than 2 seconds.  Neurological:     General: No focal deficit present.     Mental Status: She is alert and oriented to person, place, and time.  Psychiatric:        Mood and Affect: Mood normal.        Behavior: Behavior normal.        Thought Content: Thought content normal.        Judgment: Judgment normal.      Labs and Imaging No results found for this or any previous visit (from the past 24 hour(s)).  No results found.   Assessment & Plan:  1. Encounter for annual routine gynecological examination -Last pap smear in 2018 negative with negative HPV.  Repeat in 2023. -Discussed measures patient can take to minimize  exposure and spread of coronavirus.  Counseled to continue with good hand hygiene practices, avoid sick people, and practice social distancing.  Anticipate office changes over the next few weeks in response to this virus and patient counseled to call office if appointment is needed, but may anticipate some changes. -F/U in 1 year for annual.  Laurena Valko N. Earlene Plater, SNP 05/07/2018 9:27 AM

## 2018-05-22 ENCOUNTER — Ambulatory Visit: Payer: Commercial Managed Care - PPO | Admitting: Family Medicine

## 2018-07-02 ENCOUNTER — Encounter: Payer: Self-pay | Admitting: Neurology

## 2018-07-02 ENCOUNTER — Encounter: Payer: Self-pay | Admitting: Physician Assistant

## 2018-07-02 NOTE — Telephone Encounter (Signed)
Ok to write note that patient is asymptomatic of covid symptoms and should be allowed to resume massages due to chronic neck and shoulder pain associated with headaches as soon as possible.

## 2018-08-15 ENCOUNTER — Encounter: Payer: Self-pay | Admitting: Physician Assistant

## 2018-08-15 ENCOUNTER — Encounter: Payer: Self-pay | Admitting: Family Medicine

## 2018-08-15 ENCOUNTER — Telehealth: Payer: Commercial Managed Care - PPO | Admitting: Physician Assistant

## 2018-08-15 ENCOUNTER — Telehealth (INDEPENDENT_AMBULATORY_CARE_PROVIDER_SITE_OTHER): Payer: Commercial Managed Care - PPO | Admitting: Family Medicine

## 2018-08-15 VITALS — BP 113/81 | HR 84 | Temp 98.0°F | Ht 62.0 in | Wt 136.0 lb

## 2018-08-15 DIAGNOSIS — N3000 Acute cystitis without hematuria: Secondary | ICD-10-CM

## 2018-08-15 DIAGNOSIS — R300 Dysuria: Secondary | ICD-10-CM

## 2018-08-15 MED ORDER — NITROFURANTOIN MONOHYD MACRO 100 MG PO CAPS: 100.0000 mg | ORAL_CAPSULE | Freq: Two times a day (BID) | ORAL | 0 refills | Status: DC

## 2018-08-15 MED ORDER — CIPROFLOXACIN HCL 250 MG PO TABS: 250.0000 mg | ORAL_TABLET | Freq: Two times a day (BID) | ORAL | 0 refills | Status: DC

## 2018-08-15 NOTE — Progress Notes (Signed)
I have spent 5 minutes in review of e-visit questionnaire, review and updating patient chart, medical decision making and response to patient.   Alonte Wulff Cody Randeep Biondolillo, PA-C    

## 2018-08-15 NOTE — Progress Notes (Signed)
x3 days, drinking water, more burning, and frequency and strong odor. Denies any blood.  Tender abdominal pain. No flank or back pain Denies f/n/v/d.Elouise Munroe, CMA]

## 2018-08-15 NOTE — Progress Notes (Signed)
Virtual Visit via Video Note  I connected with Enedelia C Kazlauskas on 08/15/18 at  3:00 PM EDT by a video enabled telemedicine application and verified that I am speaking with the correct person using two identifiers.   I discussed the limitations of evaluation and management by telemedicine and the availability of in person appointments. The patient expressed understanding and agreed to proceed.  Pt was at home and I was in my office for the virtual visit.     Subjective:    CC: UTI sxs.   HPI: 38 year old female is seen today complaining of dysuria for 3 days.  She is been having a little bit of discomfort and burning with urination as well as some increased frequency and odor to the urine.  She denies seeing any gross blood.  Abdomen feels a little bit tender suprapubically.  No flank pain or back pain.  Denies any fever, nausea, vomiting or diarrhea.  No recent urinary tract infections and no recent antibiotic use.  No recent urinary procedures.  tok AZO this morning.    Past medical history, Surgical history, Family history not pertinant except as noted below, Social history, Allergies, and medications have been entered into the medical record, reviewed, and corrections made.   Review of Systems: No fevers, chills, night sweats, weight loss, chest pain, or shortness of breath.   Objective:    General: Speaking clearly in complete sentences without any shortness of breath.  Alert and oriented x3.  Normal judgment. No apparent acute distress. Well groomed.     Impression and Recommendations:   UTI -symptoms consistent with urinary tract infection.  No red flag symptoms and no recent antibiotic use.  She would prefer Cipro.  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  Patient will call if symptoms not resolving.  Consider culture if not improving.     I discussed the assessment and treatment plan with the patient. The patient was provided an opportunity to  ask questions and all were answered. The patient agreed with the plan and demonstrated an understanding of the instructions.   The patient was advised to call back or seek an in-person evaluation if the symptoms worsen or if the condition fails to improve as anticipated.   Beatrice Lecher, MD

## 2018-08-15 NOTE — Progress Notes (Signed)

## 2018-08-16 MED ORDER — CIPROFLOXACIN HCL 250 MG PO TABS: 250.0000 mg | ORAL_TABLET | Freq: Two times a day (BID) | ORAL | 0 refills | Status: DC

## 2018-11-13 ENCOUNTER — Ambulatory Visit (INDEPENDENT_AMBULATORY_CARE_PROVIDER_SITE_OTHER): Payer: Commercial Managed Care - PPO | Admitting: Physician Assistant

## 2018-11-13 ENCOUNTER — Other Ambulatory Visit: Payer: Self-pay

## 2018-11-13 DIAGNOSIS — Z23 Encounter for immunization: Secondary | ICD-10-CM | POA: Diagnosis not present

## 2019-01-13 ENCOUNTER — Ambulatory Visit (INDEPENDENT_AMBULATORY_CARE_PROVIDER_SITE_OTHER): Payer: Commercial Managed Care - PPO | Admitting: Physician Assistant

## 2019-01-13 ENCOUNTER — Encounter: Payer: Self-pay | Admitting: Physician Assistant

## 2019-01-13 ENCOUNTER — Other Ambulatory Visit: Payer: Self-pay

## 2019-01-13 VITALS — BP 120/86 | HR 90 | Ht 62.5 in | Wt 145.0 lb

## 2019-01-13 DIAGNOSIS — G43001 Migraine without aura, not intractable, with status migrainosus: Secondary | ICD-10-CM | POA: Diagnosis not present

## 2019-01-13 DIAGNOSIS — E280 Estrogen excess: Secondary | ICD-10-CM

## 2019-01-13 DIAGNOSIS — Z131 Encounter for screening for diabetes mellitus: Secondary | ICD-10-CM

## 2019-01-13 DIAGNOSIS — L659 Nonscarring hair loss, unspecified: Secondary | ICD-10-CM | POA: Diagnosis not present

## 2019-01-13 DIAGNOSIS — Z0000 Encounter for general adult medical examination without abnormal findings: Secondary | ICD-10-CM

## 2019-01-13 DIAGNOSIS — Z13220 Encounter for screening for lipoid disorders: Secondary | ICD-10-CM

## 2019-01-13 MED ORDER — NARATRIPTAN HCL 2.5 MG PO TABS: 2.5000 mg | ORAL_TABLET | Freq: Every day | ORAL | 5 refills | Status: DC

## 2019-01-13 NOTE — Patient Instructions (Signed)
Health Maintenance, Female Adopting a healthy lifestyle and getting preventive care are important in promoting health and wellness. Ask your health care provider about:  The right schedule for you to have regular tests and exams.  Things you can do on your own to prevent diseases and keep yourself healthy. What should I know about diet, weight, and exercise? Eat a healthy diet   Eat a diet that includes plenty of vegetables, fruits, low-fat dairy products, and lean protein.  Do not eat a lot of foods that are high in solid fats, added sugars, or sodium. Maintain a healthy weight Body mass index (BMI) is used to identify weight problems. It estimates body fat based on height and weight. Your health care provider can help determine your BMI and help you achieve or maintain a healthy weight. Get regular exercise Get regular exercise. This is one of the most important things you can do for your health. Most adults should:  Exercise for at least 150 minutes each week. The exercise should increase your heart rate and make you sweat (moderate-intensity exercise).  Do strengthening exercises at least twice a week. This is in addition to the moderate-intensity exercise.  Spend less time sitting. Even light physical activity can be beneficial. Watch cholesterol and blood lipids Have your blood tested for lipids and cholesterol at 39 years of age, then have this test every 5 years. Have your cholesterol levels checked more often if:  Your lipid or cholesterol levels are high.  You are older than 40 years of age.  You are at high risk for heart disease. What should I know about cancer screening? Depending on your health history and family history, you may need to have cancer screening at various ages. This may include screening for:  Breast cancer.  Cervical cancer.  Colorectal cancer.  Skin cancer.  Lung cancer. What should I know about heart disease, diabetes, and high blood  pressure? Blood pressure and heart disease  High blood pressure causes heart disease and increases the risk of stroke. This is more likely to develop in people who have high blood pressure readings, are of African descent, or are overweight.  Have your blood pressure checked: ? Every 3-5 years if you are 18-39 years of age. ? Every year if you are 40 years old or older. Diabetes Have regular diabetes screenings. This checks your fasting blood sugar level. Have the screening done:  Once every three years after age 40 if you are at a normal weight and have a low risk for diabetes.  More often and at a younger age if you are overweight or have a high risk for diabetes. What should I know about preventing infection? Hepatitis B If you have a higher risk for hepatitis B, you should be screened for this virus. Talk with your health care provider to find out if you are at risk for hepatitis B infection. Hepatitis C Testing is recommended for:  Everyone born from 1945 through 1965.  Anyone with known risk factors for hepatitis C. Sexually transmitted infections (STIs)  Get screened for STIs, including gonorrhea and chlamydia, if: ? You are sexually active and are younger than 39 years of age. ? You are older than 39 years of age and your health care provider tells you that you are at risk for this type of infection. ? Your sexual activity has changed since you were last screened, and you are at increased risk for chlamydia or gonorrhea. Ask your health care provider if   you are at risk.  Ask your health care provider about whether you are at high risk for HIV. Your health care provider may recommend a prescription medicine to help prevent HIV infection. If you choose to take medicine to prevent HIV, you should first get tested for HIV. You should then be tested every 3 months for as long as you are taking the medicine. Pregnancy  If you are about to stop having your period (premenopausal) and  you may become pregnant, seek counseling before you get pregnant.  Take 400 to 800 micrograms (mcg) of folic acid every day if you become pregnant.  Ask for birth control (contraception) if you want to prevent pregnancy. Osteoporosis and menopause Osteoporosis is a disease in which the bones lose minerals and strength with aging. This can result in bone fractures. If you are 65 years old or older, or if you are at risk for osteoporosis and fractures, ask your health care provider if you should:  Be screened for bone loss.  Take a calcium or vitamin D supplement to lower your risk of fractures.  Be given hormone replacement therapy (HRT) to treat symptoms of menopause. Follow these instructions at home: Lifestyle  Do not use any products that contain nicotine or tobacco, such as cigarettes, e-cigarettes, and chewing tobacco. If you need help quitting, ask your health care provider.  Do not use street drugs.  Do not share needles.  Ask your health care provider for help if you need support or information about quitting drugs. Alcohol use  Do not drink alcohol if: ? Your health care provider tells you not to drink. ? You are pregnant, may be pregnant, or are planning to become pregnant.  If you drink alcohol: ? Limit how much you use to 0-1 drink a day. ? Limit intake if you are breastfeeding.  Be aware of how much alcohol is in your drink. In the [place] one drink equals one 12 oz bottle of beer (355 mL), one 5 oz glass of wine (148 mL), or one 1 oz glass of hard liquor (44 mL). General instructions  Schedule regular health, dental, and eye exams.  Stay current with your vaccines.  Tell your health care provider if: ? You often feel depressed. ? You have ever been abused or do not feel safe at home. Summary  Adopting a healthy lifestyle and getting preventive care are important in promoting health and wellness.  Follow your health care provider's instructions about healthy  diet, exercising, and getting tested or screened for diseases.  Follow your health care provider's instructions on monitoring your cholesterol and blood pressure. This information is not intended to replace advice given to you by your health care provider. Make sure you discuss any questions you have with your health care provider. Document Released: 08/22/2010 Document Revised: 01/30/2018 Document Reviewed: 01/30/2018 Elsevier Patient Education  2020 Elsevier Inc.  

## 2019-01-13 NOTE — Progress Notes (Signed)
Subjective:     Monica Fry is a 38 y.o. female and is here for a comprehensive physical exam. The patient reports problems - see below. .  Patient went off her birth control about 4-5 months ago.  She has noticed some hair loss since.  This is diffuse hair loss.  She denies any patches.  She denies any family history of hereditary female pattern baldness.  She denies any energy changes.  Overall she feels good.  She is concerned and wants her hair loss to be evaluated.  Social History   Socioeconomic History  . Marital status: Married    Spouse name: Not on file  . Number of children: Not on file  . Years of education: Not on file  . Highest education level: Not on file  Occupational History  . Occupation: Printmaker: [employer]  Social Needs  . Financial resource strain: Not on file  . Food insecurity    Worry: Not on file    Inability: Not on file  . Transportation needs    Medical: Not on file    Non-medical: Not on file  Tobacco Use  . Smoking status: Never Smoker  . Smokeless tobacco: Never Used  Substance and Sexual Activity  . Alcohol use: Yes    Comment: very socially  . Drug use: No  . Sexual activity: Yes    Partners: Male    Birth control/protection: Other-see comments    Comment: husband had vasectomy  Lifestyle  . Physical activity    Days per week: Not on file    Minutes per session: Not on file  . Stress: Not on file  Relationships  . Social Musician on phone: Not on file    Gets together: Not on file    Attends religious service: Not on file    Active member of club or organization: Not on file    Attends meetings of clubs or organizations: Not on file    Relationship status: Not on file  . Intimate partner violence    Fear of current or ex partner: Not on file    Emotionally abused: Not on file    Physically abused: Not on file    Forced sexual activity: Not on file  Other Topics Concern  . Not on file   Social History Narrative  . Not on file   Health Maintenance  Topic Date Due  . HIV Screening  02/09/2019 (Originally 09/07/1994)  . PAP SMEAR-Modifier  04/11/2019  . TETANUS/TDAP  11/15/2023  . INFLUENZA VACCINE  Completed    The following portions of the patient's history were reviewed and updated as appropriate: allergies, current medications, past family history, past medical history, past social history, past surgical history and problem list.  Review of Systems Pertinent items noted in HPI and remainder of comprehensive ROS otherwise negative.   Objective:    BP 120/86   Pulse 90   Ht 5' 2.5" (1.588 m)   Wt 145 lb (65.8 kg)   SpO2 100%   BMI 26.10 kg/m  General appearance: alert, cooperative and appears stated age Head: Normocephalic, without obvious abnormality, atraumatic no patches of hair loss or receding hair line.  Eyes: conjunctivae/corneas clear. PERRL, EOM's intact. Fundi benign. Ears: normal TM's and external ear canals both ears Nose: Nares normal. Septum midline. Mucosa normal. No drainage or sinus tenderness. Throat: lips, mucosa, and tongue normal; teeth and gums normal Neck: no adenopathy, no carotid bruit,  no JVD, supple, symmetrical, trachea midline and thyroid not enlarged, symmetric, no tenderness/mass/nodules Back: symmetric, no curvature. ROM normal. No CVA tenderness. Lungs: clear to auscultation bilaterally Breasts: normal appearance, no masses or tenderness Heart: regular rate and rhythm, S1, S2 normal, no murmur, click, rub or gallop Abdomen: soft, non-tender; bowel sounds normal; no masses,  no organomegaly Extremities: extremities normal, atraumatic, no cyanosis or edema Pulses: 2+ and symmetric Skin: Skin color, texture, turgor normal. No rashes or lesions Lymph nodes: Cervical, supraclavicular, and axillary nodes normal. Neurologic: Alert and oriented X 3, normal strength and tone. Normal symmetric reflexes. Normal coordination and gait     Assessment:    Healthy female exam.      Plan:      Marland KitchenMarland KitchenCandace was seen today for annual exam.  Diagnoses and all orders for this visit:  Routine physical examination -     CBC w/Diff -     Ferritin -     TSH -     Estrogens, total -     FSH/LH -     Progesterone -     COMPLETE METABOLIC PANEL WITH GFR -     Lipid Panel w/reflex Direct LDL  Migraine without aura and with status migrainosus, not intractable -     naratriptan (AMERGE) 2.5 MG tablet; Take 1 tablet (2.5 mg total) by mouth daily.  Hair loss -     CBC w/Diff -     Ferritin -     TSH -     Estrogens, total -     FSH/LH -     Progesterone  Screening for diabetes mellitus -     COMPLETE METABOLIC PANEL WITH GFR  Screening for lipid disorders -     Lipid Panel w/reflex Direct LDL   .Marland Kitchen Discussed 150 minutes of exercise a week.  Encouraged vitamin D 1000 units and Calcium 1300mg  or 4 servings of dairy a day.  Fasting labs ordered.  Flu shot UTD. Pap UTD. BP to goal.   Discussed causes of hair loss. Likely coming off OCP had some effect. Will check labs. HO given. No signs of autoimmune hair loss or female patterned baldness.   See After Visit Summary for Counseling Recommendations

## 2019-01-14 NOTE — Progress Notes (Signed)
[name],   Your hemoglobin looks GREAT. Iron Stores are perfect. Thyroid perfect. HDL up to 70. Way to go. LDL down to 104. Kidney, liver, glucose look perfect.   Waiting estrogen to comment on all the hormones.   Monica Fry

## 2019-01-22 ENCOUNTER — Encounter: Payer: Self-pay | Admitting: Physician Assistant

## 2019-01-22 LAB — CBC WITH DIFFERENTIAL/PLATELET
Absolute Monocytes: 582 cells/uL (ref 200–950)
Basophils Absolute: 50 cells/uL (ref 0–200)
Basophils Relative: 0.9 %
Eosinophils Absolute: 90 cells/uL (ref 15–500)
Eosinophils Relative: 1.6 %
HCT: 43 % (ref 35.0–45.0)
Hemoglobin: 14.6 g/dL (ref 11.7–15.5)
Lymphs Abs: 1417 cells/uL (ref 850–3900)
MCH: 31 pg (ref 27.0–33.0)
MCHC: 34 g/dL (ref 32.0–36.0)
MCV: 91.3 fL (ref 80.0–100.0)
MPV: 10.5 fL (ref 7.5–12.5)
Monocytes Relative: 10.4 %
Neutro Abs: 3461 cells/uL (ref 1500–7800)
Neutrophils Relative %: 61.8 %
Platelets: 203 10*3/uL (ref 140–400)
RBC: 4.71 10*6/uL (ref 3.80–5.10)
RDW: 11.8 % (ref 11.0–15.0)
Total Lymphocyte: 25.3 %
WBC: 5.6 10*3/uL (ref 3.8–10.8)

## 2019-01-22 LAB — LIPID PANEL W/REFLEX DIRECT LDL
Cholesterol: 193 mg/dL (ref <200)
HDL: 70 mg/dL (ref >=50)
LDL Cholesterol (Calc): 104 mg/dL (calc) — ABNORMAL HIGH
Non-HDL Cholesterol (Calc): 123 mg/dL (calc) (ref <130)
Total CHOL/HDL Ratio: 2.8 (calc) (ref <5.0)
Triglycerides: 91 mg/dL (ref <150)

## 2019-01-22 LAB — COMPLETE METABOLIC PANEL WITH GFR
AG Ratio: 2 (calc) (ref 1.0–2.5)
ALT: 26 U/L (ref 6–29)
AST: 23 U/L (ref 10–30)
Albumin: 4.5 g/dL (ref 3.6–5.1)
Alkaline phosphatase (APISO): 62 U/L (ref 31–125)
BUN: 14 mg/dL (ref 7–25)
CO2: 29 mmol/L (ref 20–32)
Calcium: 9.7 mg/dL (ref 8.6–10.2)
Chloride: 103 mmol/L (ref 98–110)
Creat: 0.96 mg/dL (ref 0.50–1.10)
GFR, Est African American: 86 mL/min/{1.73_m2} (ref >=60)
GFR, Est Non African American: 74 mL/min/{1.73_m2} (ref >=60)
Globulin: 2.2 g/dL (calc) (ref 1.9–3.7)
Glucose, Bld: 83 mg/dL (ref 65–99)
Potassium: 3.9 mmol/L (ref 3.5–5.3)
Sodium: 140 mmol/L (ref 135–146)
Total Bilirubin: 0.6 mg/dL (ref 0.2–1.2)
Total Protein: 6.7 g/dL (ref 6.1–8.1)

## 2019-01-22 LAB — TSH: TSH: 1.63 mIU/L

## 2019-01-22 LAB — FSH/LH
FSH: 6.5 m[IU]/mL
LH: 13.6 m[IU]/mL

## 2019-01-22 LAB — PROGESTERONE: Progesterone: <0.5 ng/mL

## 2019-01-22 LAB — ESTROGENS, TOTAL: Estrogen: 1416.8 pg/mL — ABNORMAL HIGH

## 2019-01-22 LAB — FERRITIN: Ferritin: 111 ng/mL (ref 16–154)

## 2019-01-22 NOTE — Addendum Note (Signed)
Addended by: Donella Stade on: 01/22/2019 08:18 AM   Modules accepted: Orders

## 2019-01-22 NOTE — Progress Notes (Signed)
Also any OTC supplement or possible estrogen source in diet? You stopped birth control 6 months ago?

## 2019-01-22 NOTE — Progress Notes (Signed)
Monica Fry,   I just got your estrogen. Those levels are very high. We need to make sure you are not pregnant. I know your husband had a vasectomy but it is still possible. At times there can be lab errors as well. I would like to first get pelvic u/s. In 2 weeks we can check estrogen and other labs to make sure no other abnormalities.   Any questions?   -Luvenia Starch

## 2019-01-24 ENCOUNTER — Other Ambulatory Visit: Payer: Self-pay | Admitting: Physician Assistant

## 2019-01-24 DIAGNOSIS — E280 Estrogen excess: Secondary | ICD-10-CM

## 2019-01-24 NOTE — Telephone Encounter (Signed)
Can we see if we can make this happen [facility] for patient.

## 2019-01-24 NOTE — Telephone Encounter (Signed)
Imaging contacting PCP about this

## 2019-01-27 ENCOUNTER — Ambulatory Visit (INDEPENDENT_AMBULATORY_CARE_PROVIDER_SITE_OTHER): Payer: Commercial Managed Care - PPO

## 2019-01-27 ENCOUNTER — Other Ambulatory Visit: Payer: Self-pay

## 2019-01-27 DIAGNOSIS — E280 Estrogen excess: Secondary | ICD-10-CM

## 2019-01-27 NOTE — Progress Notes (Signed)
Emali,   Normal uterus. No pregnancy. Simple left cyst on ovary or paraovary, 1.9cm. no cause for the elevated estrogen. Get labs in 1.5weeks and will compare. At that time we can send to GYN if no answers and labs not going down.   -Luvenia Starch

## 2019-02-04 ENCOUNTER — Other Ambulatory Visit: Payer: Self-pay

## 2019-02-04 ENCOUNTER — Ambulatory Visit (INDEPENDENT_AMBULATORY_CARE_PROVIDER_SITE_OTHER): Payer: Commercial Managed Care - PPO | Admitting: Physician Assistant

## 2019-02-04 ENCOUNTER — Encounter: Payer: Self-pay | Admitting: Physician Assistant

## 2019-02-04 VITALS — BP 117/74 | HR 97 | Ht 62.0 in | Wt 150.0 lb

## 2019-02-04 DIAGNOSIS — E280 Estrogen excess: Secondary | ICD-10-CM | POA: Insufficient documentation

## 2019-02-04 DIAGNOSIS — Z1239 Encounter for other screening for malignant neoplasm of breast: Secondary | ICD-10-CM | POA: Diagnosis not present

## 2019-02-04 NOTE — Progress Notes (Signed)
Subjective:    Patient ID: Monica Fry, female    DOB: [birth year], 38 y.o.   MRN: [id number]  HPI  Patient is a 38 year old female who presents to the clinic to follow-up on abnormal elevated estrogen lab value.  She came in for her physical and hormones were checked.  Her estrogen was found to be 1416.  She is here to have more labs drawn.  She denies any symptoms except hair loss.  She does monthly breast exams and denies any mass, breast pain, nipple discharge, breast changes.  Only family history of breast cancer is her paternal grandmother.  Patient denies any abnormal vaginal bleeding.  She is having regular periods and just finished her cycle this week.  Patient has plenty of energy.  She recently had a pelvic ultrasound with no explanation of increased estrogen levels. She denies eating any soy or estrogen containing foods. She is not on any supplements.   .. Active Ambulatory Problems    Diagnosis Date Noted  . Dysmenorrhea 10/30/2012  . Allergic rhinitis 11/08/2012  . Seasonal allergies 11/08/2012  . Closed fracture of fifth metatarsal bone of left foot 04/28/2013  . H/O motion sickness 11/14/2013  . Oral contraceptive use 01/08/2014  . History of cervical dysplasia 01/12/2015  . Bursitis of left shoulder 07/15/2015  . Heart murmur on physical examination 02/07/2016  . 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 White coat syndrome with hypertension 04/10/2016  . Migraine without aura and with status migrainosus, not intractable 02/08/2018  . Estrogen increased 02/04/2019   Resolved Ambulatory Problems    Diagnosis Date Noted  . No Resolved Ambulatory Problems   Past Medical History:  Diagnosis Date  . Abnormal Pap smear of cervix 2010  . Anemia    .Marland Kitchen Family History  Problem Relation Age of Onset  . Diabetes Father   . Heart disease Father   . Hypertension Father   . Hyperlipidemia Father   . Cancer Paternal Grandmother        breast and lung  . Thrombosis Maternal  Grandmother   . Depression Maternal Grandmother   . Hyperlipidemia Maternal Grandmother   . Hypertension Maternal Grandmother   . Breast cancer Maternal Grandmother   . Heart attack Paternal Grandfather   . Hyperlipidemia Paternal Grandfather   . Hypertension Paternal Grandfather      Review of Systems  All other systems reviewed and are negative.      Objective:   Physical Exam Vitals reviewed.  Constitutional:      Appearance: Normal appearance.  Cardiovascular:     Rate and Rhythm: Normal rate and regular rhythm.  Pulmonary:     Effort: Pulmonary effort is normal.  Neurological:     General: No focal deficit present.     Mental Status: She is alert and oriented to person, place, and time.  Psychiatric:        Mood and Affect: Mood normal.           Assessment & Plan:  .Marshia was seen today for results.  Diagnoses and all orders for this visit:  Estrogen increased -     MM 3D SCREEN BREAST BILATERAL  Breast cancer screening, high risk patient -     MM 3D SCREEN BREAST BILATERAL   With elevated levels of estrogen that would increase her risk of breast cancer.  She is 38.  She has no abnormalities.  I think we should progress with a screening mammogram.  This was ordered today.  We will  get labs today and see what the estrogen level is.  We are also evaluating other markers of her hormones.  We will consider a CT of her abdomen if estrogen levels remain high and/or referral to GYN.

## 2019-02-05 NOTE — Progress Notes (Signed)
Waiting to comment until more labs release. Can we see if we can add a total estrogen as well to the labs?

## 2019-02-10 LAB — ESTRADIOL: Estradiol: 83 pg/mL

## 2019-02-10 LAB — AFP TUMOR MARKER: AFP-Tumor Marker: 2.9 ng/mL

## 2019-02-10 LAB — CP TESTOSTERONE, BIO-FEMALE/CHILDREN
Albumin: 4.5 g/dL (ref 3.6–5.1)
Sex Hormone Binding: 70 nmol/L (ref 17–124)
TESTOSTERONE, BIOAVAILABLE: 2.7 ng/dL (ref 0.5–8.5)
Testosterone, Free: 1.3 pg/mL (ref 0.2–5.0)
Testosterone, Total, LC-MS-MS: 21 ng/dL (ref 2–45)

## 2019-02-10 LAB — ANTI-MULLERIAN HORMONE (AMH), FEMALE: Anti-Mullerian Hormones(AMH), Female: 2.45 ng/mL (ref 0.18–5.68)

## 2019-02-10 LAB — HCG, QUANTITATIVE, PREGNANCY: HCG, Total, QN: <3 m[IU]/mL

## 2019-02-10 LAB — 17-HYDROXYPROGESTERONE: 17-OH-Progesterone, LC/MS/MS: 24 ng/dL

## 2019-02-10 LAB — DHEA-SULFATE: DHEA-SO4: 102 ug/dL (ref 23–266)

## 2019-02-10 LAB — LACTATE DEHYDROGENASE: LDH: 146 U/L (ref 100–200)

## 2019-02-10 LAB — ANDROSTENEDIONE: Androstenedione: 91 ng/dL

## 2019-02-10 NOTE — Progress Notes (Signed)
Jakyrah,   So far all labs are completely normal. Estradiol is perfectly normal. Total estrogen which was elevated before has not resulted but it is a combination of the estradiol and estrone.   Some labs still pending.   Appears like that could have been a lab fluke. I can't imagine your estrogen improving that much in 2 weeks.

## 2019-02-11 NOTE — Progress Notes (Signed)
We did order total estrogen on these labs as well right? It just has not resulted?

## 2019-02-26 ENCOUNTER — Other Ambulatory Visit: Payer: Self-pay | Admitting: Physician Assistant

## 2019-02-26 ENCOUNTER — Encounter: Payer: Self-pay | Admitting: Physician Assistant

## 2019-02-26 DIAGNOSIS — G43001 Migraine without aura, not intractable, with status migrainosus: Secondary | ICD-10-CM

## 2019-07-10 ENCOUNTER — Encounter: Payer: Self-pay | Admitting: Physician Assistant

## 2019-07-10 NOTE — Telephone Encounter (Signed)
Okay to send in 

## 2019-07-14 MED ORDER — MUPIROCIN 2 % EX OINT: 1.0000 "application " | TOPICAL_OINTMENT | Freq: Three times a day (TID) | CUTANEOUS | 3 refills | Status: AC

## 2019-07-28 ENCOUNTER — Other Ambulatory Visit: Payer: Self-pay | Admitting: *Deleted

## 2019-07-28 ENCOUNTER — Other Ambulatory Visit (HOSPITAL_COMMUNITY)
Admission: RE | Admit: 2019-07-28 | Discharge: 2019-07-28 | Disposition: A | Discharge status: Home / Self Care | Payer: Commercial Managed Care - PPO | Source: Ambulatory Visit | Attending: Obstetrics & Gynecology | Admitting: Obstetrics & Gynecology

## 2019-07-28 ENCOUNTER — Other Ambulatory Visit: Payer: Self-pay

## 2019-07-28 ENCOUNTER — Encounter: Payer: Self-pay | Admitting: Obstetrics & Gynecology

## 2019-07-28 ENCOUNTER — Ambulatory Visit (INDEPENDENT_AMBULATORY_CARE_PROVIDER_SITE_OTHER): Payer: Commercial Managed Care - PPO | Admitting: Obstetrics & Gynecology

## 2019-07-28 VITALS — BP 149/99 | HR 89 | Resp 16 | Ht 62.0 in | Wt 154.0 lb

## 2019-07-28 DIAGNOSIS — Z01419 Encounter for gynecological examination (general) (routine) without abnormal findings: Secondary | ICD-10-CM

## 2019-07-28 DIAGNOSIS — Z1272 Encounter for screening for malignant neoplasm of vagina: Secondary | ICD-10-CM | POA: Diagnosis not present

## 2019-07-28 DIAGNOSIS — L659 Nonscarring hair loss, unspecified: Secondary | ICD-10-CM

## 2019-07-28 MED ORDER — NORETHIN-ETH ESTRAD TRIPHASIC 0.5/0.75/1-35 MG-MCG PO TABS: ORAL_TABLET | ORAL | 12 refills | Status: DC

## 2019-07-28 NOTE — Progress Notes (Signed)
Subjective:     Monica Fry is a 39 y.o. female here for a routine exam.  Current complaints: had air loss over the winter.  Work up by PCP nml.  Will repeat TSH today.  New hair growth noted at temples.     Gynecologic History No LMP recorded. (Menstrual status: Oral contraceptives). Contraception: OCP (estrogen/progesterone) Last Pap: 2018. Results were: normal Last mammogram: n/a.   Obstetric History OB History  Gravida Para Term Preterm AB Living  2 2 2     2   SAB TAB Ectopic Multiple Live Births          2    # Outcome Date GA Lbr Len/2nd Weight Sex Delivery Anes PTL Lv  2 Term 10/26/09 [redacted]w[redacted]d 05:00 7 lb 9 oz (3.43 kg) F Vag-Spont EPI  LIV  1 Term 06/27/07 [redacted]w[redacted]d 12:00 7 lb 11 oz (3.487 kg) F Vag-Spont EPI  LIV     The following portions of the patient's history were reviewed and updated as appropriate: allergies, current medications, past family history, past medical history, past social history, past surgical history and problem list.  Review of Systems Pertinent items noted in HPI and remainder of comprehensive ROS otherwise negative.    Objective:      Vitals:   07/28/19 0810  BP: (!) 149/99  Pulse: 89  Resp: 16  Weight: 154 lb (69.9 kg)  Height: 5\' 2"  (1.575 m)   Vitals:  WNL General appearance: alert, cooperative and no distress  HEENT: Normocephalic, without obvious abnormality, atraumatic Eyes: negative\New hair growth noted at temples. Throat: lips, mucosa, and tongue normal; teeth and gums normal  Respiratory: Clear to auscultation bilaterally  CV: Regular rate and rhythm  Breasts:  Normal appearance, no masses or tenderness, no nipple retraction or dimpling  GI: Soft, non-tender; bowel sounds normal; no masses,  no organomegaly  GU: External Genitalia:  Tanner V, no lesion Urethra:  No prolapse   Vagina: Pink, normal rugae, no blood or discharge  Cervix: No CMT, no lesion  Uterus:  Normal size and contour, non tender  Adnexa: Normal, no masses,  non tender  Musculoskeletal: No edema, redness or tenderness in the calves or thighs  Skin: No lesions or rash; sun damage noted; suggest dermatology screenings (dad had melanoma)  Lymphatic: Axillary adenopathy: none     Psychiatric: Normal mood and behavior      Assessment:    Healthy female exam.    Plan:    1.  Pap with cotesting 2.  Mammogram already ordered by PCP. 3.  Rpt TSH 4.  Dermatology prn 5.  recheck BP when she returns for lab or she can take at work (pt is a 09/27/19).  Elevated today.  Pt has white coat HTN.  6.  OCP refill.

## 2019-07-29 LAB — CYTOLOGY - PAP
Comment: NEGATIVE
Diagnosis: NEGATIVE
High risk HPV: NEGATIVE

## 2019-08-08 ENCOUNTER — Encounter: Payer: Self-pay | Admitting: Physician Assistant

## 2019-08-08 DIAGNOSIS — L659 Nonscarring hair loss, unspecified: Secondary | ICD-10-CM

## 2019-08-08 DIAGNOSIS — E280 Estrogen excess: Secondary | ICD-10-CM

## 2019-08-08 DIAGNOSIS — Z79899 Other long term (current) drug therapy: Secondary | ICD-10-CM

## 2019-08-11 ENCOUNTER — Other Ambulatory Visit: Payer: Self-pay | Admitting: Obstetrics & Gynecology

## 2019-08-11 LAB — TSH: TSH: 1.64 mIU/L

## 2019-08-12 NOTE — Telephone Encounter (Signed)
Karie,   Estrogen level on the low side. I am going to see if they can add an FSH/LH and progesterone to look for signs of early menopause.

## 2019-08-13 ENCOUNTER — Encounter: Payer: Self-pay | Admitting: Physician Assistant

## 2019-08-13 NOTE — Telephone Encounter (Signed)
Monica Fry,   So your labs just don't add up. Your FSH/LH are low which usually when estrogen is low we see these levels high. Its like your pituitatry is not producing any hormones. You need to [facility] and LH to tell your ovaries to produce estrogen. It is not wonder you feel like you do. Your hormones are not right. I am sending this to Dr. Penne Lash to get her opinion and where to go from here. If you don't here anything in next few days let me know.

## 2019-08-13 NOTE — Telephone Encounter (Signed)
I did not know she was back on birth control. That explains the labs.

## 2019-08-13 NOTE — Telephone Encounter (Signed)
Hey we have a mutual patient that asked I recheck her estrogen. Its interesting findings. She has hardly any estrogen but her FSH/LH are also low. Do you know how to explain this and where to go from here? I was expecting to see FSH/LH higher and maybe premature ovarian failure.

## 2019-08-16 LAB — ESTRADIOL: Estradiol: <15 pg/mL

## 2019-08-16 LAB — FSH/LH
FSH: 1.7 m[IU]/mL
LH: <0.2 m[IU]/mL — ABNORMAL LOW

## 2019-08-16 LAB — ESTROGENS, TOTAL: Estrogen: 105.3 pg/mL

## 2019-09-22 ENCOUNTER — Encounter: Payer: Self-pay | Admitting: Physician Assistant

## 2019-09-22 DIAGNOSIS — Z1231 Encounter for screening mammogram for malignant neoplasm of breast: Secondary | ICD-10-CM

## 2019-09-26 ENCOUNTER — Encounter: Payer: Self-pay | Admitting: Nurse Practitioner

## 2019-09-26 ENCOUNTER — Ambulatory Visit (INDEPENDENT_AMBULATORY_CARE_PROVIDER_SITE_OTHER): Payer: Commercial Managed Care - PPO | Admitting: Nurse Practitioner

## 2019-09-26 ENCOUNTER — Other Ambulatory Visit: Payer: Self-pay

## 2019-09-26 VITALS — BP 159/87 | HR 111 | Ht 62.0 in | Wt 155.0 lb

## 2019-09-26 DIAGNOSIS — F410 Panic disorder [episodic paroxysmal anxiety] without agoraphobia: Secondary | ICD-10-CM

## 2019-09-26 MED ORDER — ALPRAZOLAM 0.5 MG PO TABS: ORAL_TABLET | ORAL | 1 refills | Status: DC

## 2019-09-26 NOTE — Progress Notes (Signed)
Established Patient Office Visit  Subjective:  Patient ID: Monica Fry, female    DOB: [birth year]  Age: 39 y.o. MRN: [id number]  CC: No chief complaint on file.   HPI Monica Fry presents for increased anxiety.  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 She reports that over the past two weeks she has been feeling significantly more overwhelmed. She reports that she has been experiencing intermittent periods of palpitations and panic symptoms, she is having difficulty sleeping, and she is tearful.   In the past she has used Celexa with success, but she has been off of this medication since 2007 and has not had any issues. She feels that her current symptoms are acute and is not necessarily looking into starting a new long term medication today, but would like to know if she can have something for the periods when her panic states set in to help her get through.   PHQ9- 11 GAD7- 16 She denies thoughts of self-harm. She denies constant thoughts of panic and worry.   Past Medical History:  Diagnosis Date  . Abnormal Pap smear of cervix 2010  . Anemia     Past Surgical History:  Procedure Laterality Date  . CRYOTHERAPY    . LAPAROSCOPY  2004   exploratory for endometriosis    Family History  Problem Relation Age of Onset  . Diabetes Father   . Heart disease Father   . Hypertension Father   . Hyperlipidemia Father   . Cancer Paternal Grandmother        breast and lung  . Thrombosis Maternal Grandmother   . Depression Maternal Grandmother   . Hyperlipidemia Maternal Grandmother   . Hypertension Maternal Grandmother   . Breast cancer Maternal Grandmother   . Heart attack Paternal Grandfather   . Hyperlipidemia Paternal Grandfather   . Hypertension Paternal Grandfather     Social History   Socioeconomic  History  . Marital status: Married    Spouse name: Not on file  . Number of children: Not on file  . Years of education: Not on file  . Highest education level: Not on file  Occupational History  . Occupation: Printmaker: [employer]  Tobacco Use  . Smoking status: Never Smoker  . Smokeless tobacco: Never Used  Vaping Use  . Vaping Use: Never used  Substance and Sexual Activity  . Alcohol use: Yes    Comment: very socially  . Drug use: No  . Sexual activity: Yes    Partners: Male    Birth control/protection: Pill    Comment: husband had vasectomy  Other Topics Concern  . Not on file  Social History Narrative  . Not on file   Social Determinants of Health   Financial Resource Strain:   . Difficulty of Paying Living Expenses:   Food Insecurity:   . Worried About Programme researcher, broadcasting/film/video in the Last Year:   . Barista in the Last Year:   Transportation Needs:   . Freight forwarder (Medical):   Marland Kitchen Lack of Transportation (Non-Medical):   Physical Activity:   . Days of Exercise per Week:   . Minutes of Exercise per Session:   Stress:   . Feeling of Stress :   Social Connections:   . Frequency of  Communication with Friends and Family:   . Frequency of Social Gatherings with Friends and Family:   . Attends Religious Services:   . Active Member of Clubs or Organizations:   . Attends Banker Meetings:   Marland Kitchen Marital Status:   Intimate Partner Violence:   . Fear of Current or Ex-Partner:   . Emotionally Abused:   Marland Kitchen Physically Abused:   . Sexually Abused:     Outpatient Medications Prior to Visit  Medication Sig Dispense Refill  . cetirizine (ZYRTEC) 10 MG tablet Take 10 mg by mouth daily.    . cholecalciferol (VITAMIN D3) 25 MCG (1000 UT) tablet Take 1,000 Units by mouth daily.    . fluticasone (FLONASE) 50 MCG/ACT nasal spray Place 2 sprays into the nose as needed. Reported on 07/15/2015    . Multiple Vitamin (MULTIVITAMIN ADULT PO)  Take by mouth.    . mupirocin ointment (BACTROBAN) 2 % Apply 1 application topically 3 (three) times daily. Apply to affected area for 7-10 days. 30 g 3  . naratriptan (AMERGE) 2.5 MG tablet TAKE 1 TABLET BY MOUTH EVERY DAY 9 tablet 7  . norethindrone-ethinyl estradiol (CYCLAFEM) 0.5/0.75/1-35 MG-MCG tablet TAKE 1 ACTIVE PILL BY MOUTH ONCE DAILY CONTINUOUSLY 1 Package 12   No facility-administered medications prior to visit.    No Known Allergies    Objective:    Physical Exam Vitals and nursing note reviewed.  Constitutional:      Appearance: Normal appearance.  HENT:     Head: Normocephalic.  Eyes:     Extraocular Movements: Extraocular movements intact.     Conjunctiva/sclera: Conjunctivae normal.     Pupils: Pupils are equal, round, and reactive to light.  Cardiovascular:     Rate and Rhythm: Normal rate and regular rhythm.  Pulmonary:     Effort: Pulmonary effort is normal.  Abdominal:     General: Abdomen is flat.     Palpations: Abdomen is soft.  Musculoskeletal:        General: Normal range of motion.     Cervical back: Normal range of motion.  Skin:    General: Skin is warm and dry.  Neurological:     General: No focal deficit present.     Mental Status: She is alert and oriented to person, place, and time.  Psychiatric:        Mood and Affect: Mood is anxious. Affect is tearful.        Behavior: Behavior normal.        Thought Content: Thought content normal.        Judgment: Judgment normal.     There were no vitals taken for this visit. Wt Readings from Last 3 Encounters:  07/28/19 154 lb (69.9 kg)  02/04/19 150 lb (68 kg)  01/13/19 145 lb (65.8 kg)     Health Maintenance Due  Topic Date Due  . Hepatitis C Screening  Never done  . COVID-19 Vaccine (1) Never done  . HIV Screening  Never done  . INFLUENZA VACCINE  09/21/2019    There are no preventive care reminders to display for this patient.  Lab Results  Component Value Date   TSH 1.64  08/11/2019   Lab Results  Component Value Date   WBC 5.6 01/13/2019   HGB 14.6 01/13/2019   HCT 43.0 01/13/2019   MCV 91.3 01/13/2019   PLT 203 01/13/2019   Lab Results  Component Value Date   NA 140 01/13/2019   K 3.9 01/13/2019  CO2 29 01/13/2019   GLUCOSE 83 01/13/2019   BUN 14 01/13/2019   CREATININE 0.96 01/13/2019   BILITOT 0.6 01/13/2019   ALKPHOS 36 02/08/2016   AST 23 01/13/2019   ALT 26 01/13/2019   PROT 6.7 01/13/2019   ALBUMIN 3.8 02/08/2016   CALCIUM 9.7 01/13/2019   Lab Results  Component Value Date   CHOL 193 01/13/2019   Lab Results  Component Value Date   HDL 70 01/13/2019   Lab Results  Component Value Date   LDLCALC 104 (H) 01/13/2019   Lab Results  Component Value Date   TRIG 91 01/13/2019   Lab Results  Component Value Date   CHOLHDL 2.8 01/13/2019   No results found for: HGBA1C    Assessment & Plan:   1. Panic attacks Symptoms and presentation consistent with panic attacks related to recent increased job stress due to the pandemic. Her symptoms are intermittent and not continuous, but when they are present they can be severe. She is tearful in the interview today.  We discussed medication options and a joint decision was made to trial xanax 0.25mg  to 0.5mg  for periods of extreme panic and anxiety. We did discuss using this medication on a very sparing basis. If symptoms persist for more than 6 weeks, we discussed moving to a long term medication to help with underlying control of her symptoms. She is in agreement to this plan.  - ALPRAZolam (XANAX) 0.5 MG tablet; Take 1/2 (0.25mg ) to 1 (0.5mg ) tab up to three times per day as needed for severe anxiety and panic.  Dispense: 40 tablet; Refill: 1  Return in about 6 weeks (around 11/07/2019) for Anxiety.  Tollie Eth, NP

## 2019-09-26 NOTE — Patient Instructions (Signed)

## 2019-10-06 ENCOUNTER — Encounter: Payer: Self-pay | Admitting: Physician Assistant

## 2019-10-09 ENCOUNTER — Other Ambulatory Visit: Payer: Self-pay

## 2019-10-09 ENCOUNTER — Emergency Department (INDEPENDENT_AMBULATORY_CARE_PROVIDER_SITE_OTHER): Payer: Commercial Managed Care - PPO

## 2019-10-09 ENCOUNTER — Encounter: Payer: Self-pay | Admitting: Emergency Medicine

## 2019-10-09 ENCOUNTER — Emergency Department (INDEPENDENT_AMBULATORY_CARE_PROVIDER_SITE_OTHER)
Admission: EM | Admit: 2019-10-09 | Discharge: 2019-10-09 | Disposition: A | Discharge status: Home / Self Care | Payer: Commercial Managed Care - PPO | Source: Home / Self Care

## 2019-10-09 DIAGNOSIS — R0789 Other chest pain: Secondary | ICD-10-CM

## 2019-10-09 DIAGNOSIS — R9431 Abnormal electrocardiogram [ECG] [EKG]: Secondary | ICD-10-CM | POA: Diagnosis not present

## 2019-10-09 DIAGNOSIS — L298 Other pruritus: Secondary | ICD-10-CM

## 2019-10-09 DIAGNOSIS — L299 Pruritus, unspecified: Secondary | ICD-10-CM

## 2019-10-09 DIAGNOSIS — F410 Panic disorder [episodic paroxysmal anxiety] without agoraphobia: Secondary | ICD-10-CM

## 2019-10-09 LAB — TROPONIN I: Troponin I: <3 ng/L (ref <=47)

## 2019-10-09 MED ORDER — LABETALOL HCL 100 MG PO TABS: 100.0000 mg | ORAL_TABLET | Freq: Two times a day (BID) | ORAL | 0 refills | Status: DC

## 2019-10-09 MED ORDER — HYDROXYZINE HCL 25 MG PO TABS: 25.0000 mg | ORAL_TABLET | Freq: Four times a day (QID) | ORAL | 0 refills | Status: DC

## 2019-10-09 NOTE — Discharge Instructions (Addendum)
  Atarax (hydroxizine) is an antihistamine that can be taken to help with itching. This medication can cause drowsiness so do not drive or drink alcohol while taking.    Be sure to monitor your blood pressure at home and take those readings with you to your next appointment.  Please follow up with primary care on Monday to discuss today's visit.    Call 911 or have someone drive you to the hospital if symptoms worsening- worsening chest pain, trouble breathing, dizziness/passing out, or other new concerning symptoms develop.

## 2019-10-09 NOTE — ED Provider Notes (Signed)
Monica Fry CARE    CSN: [id number] Arrival date & time: 10/09/19  1432      History   Chief Complaint Chief Complaint  Patient presents with  . Panic Attack    HPI Monica Fry is a 39 y.o. female.   HPI  Monica Fry is a 39 y.o. female presenting to UC with c/o chest tightness, and itching red rash all over, worse on her Left upper arm where she has been scratching.  Chest discomfort started when she woke up this morning around 8AM but has persisted, a mild dull ache.  She had a nurse at work check her BP, which was 160s/110s so she decided to be evaluated. No prior of CAD with her or her family.  She was seen by her PCP on 09/26/19 for panic attacks. She was prescribed 0.25-0.5mg  xanax PRN.  She has taken some this morning with mild relief.  She attributes her stress and anxiety to stress at work in a pharmacy, her children going back to school and the Covid-19 pandemic still going on.  Pt denies fever, chills, cough, congestion, n/v/d. No sick contacts or recent travel. She has been fully vaccinated.  She does report having extensive workup for the itchy rash that appears to come on in times of stress over the last 1 year. She has been to her PCP, dermatology and an allergist but no relief from steroids or antihistamines.   Once she is asleep, the itching stops and rash resolves until she wakes in the morning feeling stressed again.  She has had her thyroid and LFTs checked, normal.   Past Medical History:  Diagnosis Date  . Abnormal Pap smear of cervix 2010  . Anemia     Patient Active Problem List   Diagnosis Date Noted  . Estrogen increased 02/04/2019  . Migraine without aura and with status migrainosus, not intractable 02/08/2018  . White coat syndrome with hypertension 04/10/2016  . Heart murmur on physical examination 02/07/2016  . 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 Bursitis of left shoulder 07/15/2015  . History of cervical dysplasia 01/12/2015  . H/O motion  sickness 11/14/2013  . Closed fracture of fifth metatarsal bone of left foot 04/28/2013  . Allergic rhinitis 11/08/2012  . Seasonal allergies 11/08/2012  . Dysmenorrhea 10/30/2012    Past Surgical History:  Procedure Laterality Date  . CRYOTHERAPY    . LAPAROSCOPY  2004   exploratory for endometriosis    OB History    Gravida  2   Para  2   Term  2   Preterm      AB      Living  2     SAB      TAB      Ectopic      Multiple      Live Births  2            Home Medications    Prior to Admission medications   Medication Sig Start Date End Date Taking? Authorizing Provider  ALPRAZolam Prudy Feeler) 0.5 MG tablet Take 1/2 (0.25mg ) to 1 (0.5mg ) tab up to three times per day as needed for severe anxiety and panic. 09/26/19   Early, Sung Amabile, NP  cetirizine (ZYRTEC) 10 MG tablet Take 10 mg by mouth daily.    [provider]  cholecalciferol (VITAMIN D3) 25 MCG (1000 UT) tablet Take 1,000 Units by mouth daily.    [provider]  cyclobenzaprine (FLEXERIL) 10 MG tablet Take 10 mg  by mouth as needed for muscle spasms.    [provider]  fluticasone (FLONASE) 50 MCG/ACT nasal spray Place 2 sprays into the nose as needed. Reported on 07/15/2015 11/08/12   Jomarie Longs, PA-C  hydrOXYzine (ATARAX/VISTARIL) 25 MG tablet Take 1 tablet (25 mg total) by mouth every 6 (six) hours. 10/09/19   Lurene Shadow, PA-C  labetalol (NORMODYNE) 100 MG tablet Take 1 tablet (100 mg total) by mouth 2 (two) times daily. 10/09/19   Lurene Shadow, PA-C  Multiple Vitamin (MULTIVITAMIN ADULT PO) Take by mouth.    [provider]  mupirocin ointment (BACTROBAN) 2 % Apply 1 application topically 3 (three) times daily. Apply to affected area for 7-10 days. 07/14/19   Breeback, Jade L, PA-C  naratriptan (AMERGE) 2.5 MG tablet TAKE 1 TABLET BY MOUTH EVERY DAY 02/27/19   Breeback, Jade L, PA-C  norethindrone-ethinyl estradiol (CYCLAFEM) 0.5/0.75/1-35 MG-MCG tablet TAKE 1  ACTIVE PILL BY MOUTH ONCE DAILY CONTINUOUSLY 07/28/19   Lesly Dukes, MD  ondansetron (ZOFRAN) 8 MG tablet Take by mouth every 8 (eight) hours as needed for nausea or vomiting.    [provider]    Family History Family History  Problem Relation Age of Onset  . Diabetes Father   . Heart disease Father   . Hypertension Father   . Hyperlipidemia Father   . Cancer Paternal Grandmother        breast and lung  . Thrombosis Maternal Grandmother   . Depression Maternal Grandmother   . Hyperlipidemia Maternal Grandmother   . Hypertension Maternal Grandmother   . Breast cancer Maternal Grandmother   . Heart attack Paternal Grandfather   . Hyperlipidemia Paternal Grandfather   . Hypertension Paternal Grandfather     Social History Social History   Tobacco Use  . Smoking status: Never Smoker  . Smokeless tobacco: Never Used  Vaping Use  . Vaping Use: Never used  Substance Use Topics  . Alcohol use: Yes    Comment: very socially  . Drug use: No     Allergies   Patient has no known allergies.   Review of Systems Review of Systems  Constitutional: Negative for chills and fever.  Respiratory: Positive for chest tightness. Negative for cough and shortness of breath.   Cardiovascular: Positive for chest pain and palpitations (mild). Negative for leg swelling.  Gastrointestinal: Negative for nausea and vomiting.  Skin: Positive for rash. Negative for wound.  Psychiatric/Behavioral: The patient is nervous/anxious.      Physical Exam Triage Vital Signs ED Triage Vitals  Enc Vitals Group     BP 10/09/19 1446 (!) 160/118     Pulse Rate 10/09/19 1445 88     Resp 10/09/19 1446 18     Temp 10/09/19 1445 98.6 F (37 C)     Temp Source 10/09/19 1445 Oral     SpO2 10/09/19 1446 100 %     Weight 10/09/19 1447 150 lb (68 kg)     Height 10/09/19 1447 5\' 3"  (1.6 m)     Head Circumference --      Peak Flow --      Pain Score 10/09/19 1447 0     Pain Loc --      Pain  Edu? --      Excl. in GC? --    No data found.  Updated Vital Signs BP (!) 143/98 (BP Location: Right Arm)   Pulse 88   Temp 98.6 F (37 C) (Oral)  Resp 16   Ht 5\' 3"  (1.6 m)   Wt 150 lb (68 kg)   SpO2 100%   BMI 26.57 kg/m   Visual Acuity Right Eye Distance:   Left Eye Distance:   Bilateral Distance:    Right Eye Near:   Left Eye Near:    Bilateral Near:     Physical Exam Vitals and nursing note reviewed.  Constitutional:      Appearance: Normal appearance. She is well-developed.     Comments: Sitting in exam chair, appears mildly anxious, speaking fast initially but able to calm down easily.  HENT:     Head: Normocephalic and atraumatic.     Right Ear: Tympanic membrane and ear canal normal.     Left Ear: Tympanic membrane and ear canal normal.     Nose: Nose normal.     Mouth/Throat:     Lips: Pink.     Mouth: Mucous membranes are moist.     Pharynx: Oropharynx is clear. Uvula midline.  Cardiovascular:     Rate and Rhythm: Normal rate and regular rhythm.  Pulmonary:     Effort: Pulmonary effort is normal. No respiratory distress.     Breath sounds: Normal breath sounds. No stridor. No wheezing, rhonchi or rales.  Musculoskeletal:        General: Normal range of motion.     Cervical back: Normal range of motion and neck supple. No tenderness.  Lymphadenopathy:     Cervical: No cervical adenopathy.  Skin:    General: Skin is warm and dry.     Capillary Refill: Capillary refill takes less than 2 seconds.  Neurological:     Mental Status: She is alert and oriented to person, place, and time.  Psychiatric:        Behavior: Behavior normal.      UC Treatments / Results  Labs (all labs ordered are listed, but only abnormal results are displayed) Labs Reviewed  TROPONIN I (HIGH SENSITIVITY)    EKG Date/Time:10/09/2019    15:11:12 Ventricular Rate: 89 PR Interval: 120 QRS Duration: 78 QT Interval: 388 QTC Calculation: 472 P-R-T axes: 63   7    5 Text Interpretation: Normal sinus rhythm, low voltage QRS. Nonspecific ST abnormality. Abnormal ECG.  No prior to compare  Radiology DG Chest 2 View  Result Date: 10/09/2019 CLINICAL DATA:  39 year old female with chest tightness. EXAM: CHEST - 2 VIEW COMPARISON:  None. FINDINGS: The heart size and mediastinal contours are within normal limits. Both lungs are clear. The visualized skeletal structures are unremarkable. IMPRESSION: No active cardiopulmonary disease. Electronically Signed   By: Elgie CollardArash  Radparvar M.D.   On: 10/09/2019 16:04    Procedures Procedures (including critical care time)  Medications Ordered in UC Medications - No data to display  Initial Impression / Assessment and Plan / UC Course  I have reviewed the triage vital signs and the nursing notes.  Pertinent labs & imaging results that were available during my care of the patient were reviewed by me and considered in my medical decision making (see chart for details).     Discussed CXR and EKG with pt Pt is low risk for ACS but agreeable to have stat troponin sent from UC Pt given strict instructions to call 911 or have someone drive her to emergency department should symptoms worsen. Will start pt on low dose of atarax for itching and low dose labetalol  Pt has previously scheduled f/u with her PCP on Monday, 10/13/19, encouraged to  keep the appointment AVS given  Final Clinical Impressions(s) / UC Diagnoses   Final diagnoses:  Anxiety attack  Pruritus  Chest tightness  Nonspecific ST-T wave electrocardiographic changes  Pruritic erythematous rash     Discharge Instructions      Atarax (hydroxizine) is an antihistamine that can be taken to help with itching. This medication can cause drowsiness so do not drive or drink alcohol while taking.    Be sure to monitor your blood pressure at home and take those readings with you to your next appointment.  Please follow up with primary care on Monday to  discuss today's visit.    Call 911 or have someone drive you to the hospital if symptoms worsening- worsening chest pain, trouble breathing, dizziness/passing out, or other new concerning symptoms develop.     ED Prescriptions    Medication Sig Dispense Auth. Provider   hydrOXYzine (ATARAX/VISTARIL) 25 MG tablet Take 1 tablet (25 mg total) by mouth every 6 (six) hours. 12 tablet Waylan Rocher O, PA-C   labetalol (NORMODYNE) 100 MG tablet Take 1 tablet (100 mg total) by mouth 2 (two) times daily. 60 tablet Lurene Shadow, [place]     PDMP not reviewed this encounter.   Lurene Shadow, [place] 10/09/19 1721

## 2019-10-09 NOTE — ED Notes (Signed)
STAT Confirmation# [id number], 4:15

## 2019-10-09 NOTE — ED Triage Notes (Addendum)
Last week was seen for anxiety and given ativan. Today having chest tightness, scratching herself, she feels anxious from that, HBP, Vaccinated

## 2019-10-13 ENCOUNTER — Encounter: Payer: Self-pay | Admitting: Physician Assistant

## 2019-10-13 ENCOUNTER — Ambulatory Visit (INDEPENDENT_AMBULATORY_CARE_PROVIDER_SITE_OTHER): Payer: Commercial Managed Care - PPO | Admitting: Physician Assistant

## 2019-10-13 VITALS — BP 140/100 | HR 87 | Temp 98.3°F | Wt 158.0 lb

## 2019-10-13 DIAGNOSIS — F419 Anxiety disorder, unspecified: Secondary | ICD-10-CM | POA: Diagnosis not present

## 2019-10-13 DIAGNOSIS — R030 Elevated blood-pressure reading, without diagnosis of hypertension: Secondary | ICD-10-CM

## 2019-10-13 DIAGNOSIS — L503 Dermatographic urticaria: Secondary | ICD-10-CM

## 2019-10-13 DIAGNOSIS — L509 Urticaria, unspecified: Secondary | ICD-10-CM

## 2019-10-13 DIAGNOSIS — F439 Reaction to severe stress, unspecified: Secondary | ICD-10-CM | POA: Insufficient documentation

## 2019-10-13 DIAGNOSIS — F410 Panic disorder [episodic paroxysmal anxiety] without agoraphobia: Secondary | ICD-10-CM

## 2019-10-13 DIAGNOSIS — R2232 Localized swelling, mass and lump, left upper limb: Secondary | ICD-10-CM

## 2019-10-13 DIAGNOSIS — Z23 Encounter for immunization: Secondary | ICD-10-CM | POA: Diagnosis not present

## 2019-10-13 DIAGNOSIS — R2231 Localized swelling, mass and lump, right upper limb: Secondary | ICD-10-CM

## 2019-10-13 DIAGNOSIS — R000 Tachycardia, unspecified: Secondary | ICD-10-CM

## 2019-10-13 MED ORDER — BUSPIRONE HCL 5 MG PO TABS: 5.0000 mg | ORAL_TABLET | Freq: Three times a day (TID) | ORAL | 0 refills | Status: DC

## 2019-10-13 MED ORDER — HYDROCHLOROTHIAZIDE 12.5 MG PO TABS: 12.5000 mg | ORAL_TABLET | Freq: Every day | ORAL | 0 refills | Status: DC

## 2019-10-13 NOTE — Progress Notes (Signed)
Subjective:    Patient ID: Monica Fry, female    DOB: 07/01/1980, 39 y.o.   MRN: [id number]  HPI  Patient is a 39 year old female with history of anxiety induced urticaria and dermatographia who presents to the clinic with concerns related to her stress level.  In July she took a week vacation where she completely relax.  She came back to a really stressful environment with lots of problems.  She immediately felt signs and symptoms of anxiety and stress.in early august itching became a problem and vistaril as needed was started.  She started having her dermatographia and urticaria again. Hx of getting this during stressful events in life like pregnancies, sickness.  She has started feeling "puffy in hands and arms".   1 night she was having chest pains so she went to the ED.  EKG was normal, troponin was negative but her blood pressure was very elevated. She was started on labetolol for BP. She has checked at home and been in 130s over 80s. She has tried celexa a long time ago. She usually does not need medication but she feels she needs something now.    .. Active Ambulatory Problems    Diagnosis Date Noted  . Dysmenorrhea 10/30/2012  . Allergic rhinitis 11/08/2012  . Seasonal allergies 11/08/2012  . H/O motion sickness 11/14/2013  . History of cervical dysplasia 01/12/2015  . Heart murmur on physical examination 02/07/2016  . 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 White coat syndrome with hypertension 04/10/2016  . Migraine without aura and with status migrainosus, not intractable 02/08/2018  . Estrogen increased 02/04/2019  . Elevated blood pressure reading 10/13/2019  . Stress 10/13/2019  . Anxiety 10/13/2019  . Urticaria 10/13/2019  . Tachycardia 10/20/2019  . Localized swelling of both hands 10/20/2019   Resolved Ambulatory Problems    Diagnosis Date Noted  . Closed fracture of fifth metatarsal bone of left foot 04/28/2013  . Oral contraceptive use 01/08/2014  . Bursitis of left  shoulder 07/15/2015   Past Medical History:  Diagnosis Date  . Abnormal Pap smear of cervix 2010  . Anemia       Review of Systems See HPI.     Objective:   Physical Exam Vitals reviewed.  Constitutional:      Appearance: Normal appearance.  HENT:     Head: Normocephalic.  Neck:     Vascular: No carotid bruit.  Cardiovascular:     Rate and Rhythm: Normal rate and regular rhythm.     Heart sounds: No murmur heard.   Pulmonary:     Effort: Pulmonary effort is normal.     Breath sounds: Normal breath sounds.  Lymphadenopathy:     Cervical: No cervical adenopathy.  Neurological:     General: No focal deficit present.     Mental Status: She is alert and oriented to person, place, and time.  Psychiatric:     Comments: anxious       .Marland Kitchen Depression screen [facility] 2/9 09/26/2019 02/04/2019 01/13/2019 08/15/2018 02/08/2018  Decreased Interest 2 0 0 0 0  Down, Depressed, Hopeless 2 0 0 0 0  PHQ - 2 Score 4 0 0 0 0  Altered sleeping 3 0 0 - 1  Tired, decreased energy 2 1 0 - 1  Change in appetite 0 0 0 - 0  Feeling bad or failure about yourself  0 0 0 - 0  Trouble concentrating 0 0 0 - 0  Moving slowly or fidgety/restless 2 0 0 - 0  Suicidal thoughts 0 0 0 - 0  PHQ-9 Score 11 1 0 - 2  Difficult doing work/chores Somewhat difficult Not difficult at all Not difficult at all - Not difficult at all   .Marland Kitchen GAD 7 : Generalized Anxiety Score 09/26/2019 02/04/2019 01/13/2019 02/08/2018  Nervous, Anxious, on Edge 3 0 0 0  Control/stop worrying 3 0 0 0  Worry too much - different things 3 0 0 0  Trouble relaxing 3 1 0 1  Restless 2 0 0 0  Easily annoyed or irritable 2 1 0 0  Afraid - awful might happen 0 0 0 0  Total GAD 7 Score 16 2 0 1  Anxiety Difficulty Somewhat difficult Not difficult at all Not difficult at all Not difficult at all        Assessment & Plan:  Marland KitchenMarland KitchenCandace was seen today for urticaria.  Diagnoses and all orders for this visit:  Elevated blood pressure  reading -     COMPLETE METABOLIC PANEL WITH GFR -     Brain natriuretic peptide -     CBC -     hydrochlorothiazide (HYDRODIURIL) 12.5 MG tablet; Take 1 tablet (12.5 mg total) by mouth daily. As needed for peripheral edema.  Need for influenza vaccination -     Flu Vaccine QUAD 6+ mos PF IM (Fluarix Quad PF)  Urticaria -     COMPLETE METABOLIC PANEL WITH GFR -     Brain natriuretic peptide -     CBC -     Ambulatory referral to Psychology  Anxiety -     busPIRone (BUSPAR) 5 MG tablet; Take 1 tablet (5 mg total) by mouth 3 (three) times daily. -     COMPLETE METABOLIC PANEL WITH GFR -     Brain natriuretic peptide -     CBC -     Ambulatory referral to Psychology  Stress -     busPIRone (BUSPAR) 5 MG tablet; Take 1 tablet (5 mg total) by mouth 3 (three) times daily. -     COMPLETE METABOLIC PANEL WITH GFR -     Brain natriuretic peptide -     CBC -     Ambulatory referral to Psychology  Tachycardia -     Ambulatory referral to Psychology  Localized swelling of both hands -     COMPLETE METABOLIC PANEL WITH GFR -     Brain natriuretic peptide -     CBC -     hydrochlorothiazide (HYDRODIURIL) 12.5 MG tablet; Take 1 tablet (12.5 mg total) by mouth daily. As needed for peripheral edema.  Dermatographia  Panic attacks   PHQ and GAD numbers are very high.  I suspect anxiety/stress triggering skin changes and BP elevation. No new murmur heard. No lower ext edema to suggest any heart failure. She does have some swelling of hands.  Will check CMP/BNP/CBC.  At patients request given HCTZ low dose to take as needed for any swelling or persistent BP elevation.  For now continue on labetolol started in UC.  Keep check on BP if not staying under 140/90 let me know.  Added buspar three times a day with vistaril for itching/anxiety as needed and xanax as last result.  We discussed SSRI but she would like to hold off at this time because she wants to see if this was just work induced  and how she can cope.  Encouraged meditation and exercise.  Referral made for counseling.   Follow up in 4 weeks.  Spent 35 minutes with patient.

## 2019-10-13 NOTE — Patient Instructions (Signed)
Can take vistaril for rescue anxiety instead of xanax if you want to try first.  Start buspar three times day.

## 2019-10-14 ENCOUNTER — Other Ambulatory Visit: Payer: Self-pay | Admitting: Physician Assistant

## 2019-10-14 LAB — COMPLETE METABOLIC PANEL WITH GFR
AG Ratio: 1.8 (calc) (ref 1.0–2.5)
ALT: 8 U/L (ref 6–29)
AST: 14 U/L (ref 10–30)
Albumin: 4.1 g/dL (ref 3.6–5.1)
Alkaline phosphatase (APISO): 49 U/L (ref 31–125)
BUN: 16 mg/dL (ref 7–25)
CO2: 27 mmol/L (ref 20–32)
Calcium: 9.1 mg/dL (ref 8.6–10.2)
Chloride: 105 mmol/L (ref 98–110)
Creat: 0.84 mg/dL (ref 0.50–1.10)
GFR, Est African American: 101 mL/min/{1.73_m2} (ref >=60)
GFR, Est Non African American: 87 mL/min/{1.73_m2} (ref >=60)
Globulin: 2.3 g/dL (calc) (ref 1.9–3.7)
Glucose, Bld: 102 mg/dL — ABNORMAL HIGH (ref 65–99)
Potassium: 3.4 mmol/L — ABNORMAL LOW (ref 3.5–5.3)
Sodium: 140 mmol/L (ref 135–146)
Total Bilirubin: 0.3 mg/dL (ref 0.2–1.2)
Total Protein: 6.4 g/dL (ref 6.1–8.1)

## 2019-10-14 LAB — CBC
HCT: 40.7 % (ref 35.0–45.0)
Hemoglobin: 13.6 g/dL (ref 11.7–15.5)
MCH: 30.7 pg (ref 27.0–33.0)
MCHC: 33.4 g/dL (ref 32.0–36.0)
MCV: 91.9 fL (ref 80.0–100.0)
MPV: 10.6 fL (ref 7.5–12.5)
Platelets: 233 10*3/uL (ref 140–400)
RBC: 4.43 10*6/uL (ref 3.80–5.10)
RDW: 11.9 % (ref 11.0–15.0)
WBC: 6.9 10*3/uL (ref 3.8–10.8)

## 2019-10-14 LAB — BRAIN NATRIURETIC PEPTIDE: Brain Natriuretic Peptide: 14 pg/mL (ref <100)

## 2019-10-14 MED ORDER — POTASSIUM CHLORIDE CRYS ER 10 MEQ PO TBCR: EXTENDED_RELEASE_TABLET | ORAL | 1 refills | Status: DC

## 2019-10-14 NOTE — Progress Notes (Signed)
Dailynn,   Potassium already on the low side. If you were to take HCTZ could drop it lower. I will send over potassium to take anytime you take HCTZ.

## 2019-10-14 NOTE — Progress Notes (Signed)
BNP is normal. Swelling likely not coming from heart.

## 2019-10-16 ENCOUNTER — Ambulatory Visit (INDEPENDENT_AMBULATORY_CARE_PROVIDER_SITE_OTHER): Payer: Commercial Managed Care - PPO

## 2019-10-16 ENCOUNTER — Other Ambulatory Visit: Payer: Self-pay

## 2019-10-16 DIAGNOSIS — Z1231 Encounter for screening mammogram for malignant neoplasm of breast: Secondary | ICD-10-CM | POA: Diagnosis not present

## 2019-10-17 NOTE — Progress Notes (Signed)
Normal mammogram. Follow up in one year.

## 2019-10-20 ENCOUNTER — Encounter: Payer: Self-pay | Admitting: Physician Assistant

## 2019-10-20 DIAGNOSIS — R2231 Localized swelling, mass and lump, right upper limb: Secondary | ICD-10-CM | POA: Insufficient documentation

## 2019-10-20 DIAGNOSIS — R000 Tachycardia, unspecified: Secondary | ICD-10-CM | POA: Insufficient documentation

## 2019-11-04 ENCOUNTER — Other Ambulatory Visit: Payer: Self-pay | Admitting: Physician Assistant

## 2019-11-04 DIAGNOSIS — F439 Reaction to severe stress, unspecified: Secondary | ICD-10-CM

## 2019-11-04 DIAGNOSIS — R2232 Localized swelling, mass and lump, left upper limb: Secondary | ICD-10-CM

## 2019-11-04 DIAGNOSIS — R030 Elevated blood-pressure reading, without diagnosis of hypertension: Secondary | ICD-10-CM

## 2019-11-04 DIAGNOSIS — F419 Anxiety disorder, unspecified: Secondary | ICD-10-CM

## 2019-11-15 ENCOUNTER — Other Ambulatory Visit: Payer: Self-pay | Admitting: Physician Assistant

## 2019-11-18 ENCOUNTER — Encounter: Payer: Self-pay | Admitting: Physician Assistant

## 2019-11-28 ENCOUNTER — Ambulatory Visit (INDEPENDENT_AMBULATORY_CARE_PROVIDER_SITE_OTHER): Payer: Commercial Managed Care - PPO | Admitting: Physician Assistant

## 2019-11-28 ENCOUNTER — Other Ambulatory Visit: Payer: Self-pay

## 2019-11-28 VITALS — BP 143/92 | HR 106 | Ht 63.0 in | Wt 155.0 lb

## 2019-11-28 DIAGNOSIS — F419 Anxiety disorder, unspecified: Secondary | ICD-10-CM | POA: Diagnosis not present

## 2019-11-28 DIAGNOSIS — N938 Other specified abnormal uterine and vaginal bleeding: Secondary | ICD-10-CM

## 2019-11-28 DIAGNOSIS — R030 Elevated blood-pressure reading, without diagnosis of hypertension: Secondary | ICD-10-CM | POA: Diagnosis not present

## 2019-11-28 DIAGNOSIS — R4589 Other symptoms and signs involving emotional state: Secondary | ICD-10-CM | POA: Diagnosis not present

## 2019-11-28 MED ORDER — CITALOPRAM HYDROBROMIDE 10 MG PO TABS: 10.0000 mg | ORAL_TABLET | Freq: Every day | ORAL | 0 refills | Status: DC

## 2019-11-28 NOTE — Progress Notes (Signed)
Subjective:    Patient ID: Monica Fry, female    DOB: Oct 05, 1980, 39 y.o.   MRN: [id number]  HPI  Pt is a 39 yo female who presents to the clinic to follow up on anxiety. Last visit things were really bad at work. She was feeling panic all the time. She was having CP, itching, flushing, and elevated BP readings. buspar was started and helped a lot with anxiety but now she feels more depressed. Denies any SI/HC. Her mood is worsened mostly by work. She is overworked and overwhelmed. She feels like there is no out for her there. She is looking for another job.   She was having DUB. She realized her OCP had changed. 2nd month of OCP bleeding improved.   Pt is not taking labetolol or HCtZ regularly. No CP, palpitations, headaches, or vision changes.   .. Active Ambulatory Problems    Diagnosis Date Noted  . Dysmenorrhea 10/30/2012  . Allergic rhinitis 11/08/2012  . Seasonal allergies 11/08/2012  . H/O motion sickness 11/14/2013  . History of cervical dysplasia 01/12/2015  . Heart murmur on physical examination 02/07/2016  . 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 White coat syndrome with hypertension 04/10/2016  . Migraine without aura and with status migrainosus, not intractable 02/08/2018  . Estrogen increased 02/04/2019  . Elevated blood pressure reading 10/13/2019  . Stress 10/13/2019  . Anxiety 10/13/2019  . Urticaria 10/13/2019  . Tachycardia 10/20/2019  . Localized swelling of both hands 10/20/2019  . Depressed mood 12/01/2019   Resolved Ambulatory Problems    Diagnosis Date Noted  . Closed fracture of fifth metatarsal bone of left foot 04/28/2013  . Oral contraceptive use 01/08/2014  . Bursitis of left shoulder 07/15/2015   Past Medical History:  Diagnosis Date  . Abnormal Pap smear of cervix 2010  . Anemia       Review of Systems  All other systems reviewed and are negative.      Objective:   Physical Exam Vitals reviewed.  Constitutional:      Appearance:  Normal appearance.  Cardiovascular:     Rate and Rhythm: Regular rhythm. Tachycardia present.     Pulses: Normal pulses.  Pulmonary:     Effort: Pulmonary effort is normal.  Neurological:     General: No focal deficit present.     Mental Status: She is alert and oriented to person, place, and time.  Psychiatric:     Comments: tearful       .Marland Kitchen Depression screen [facility] 2/9 11/28/2019 09/26/2019 02/04/2019 01/13/2019 08/15/2018  Decreased Interest 2 2 0 0 0  Down, Depressed, Hopeless 2 2 0 0 0  PHQ - 2 Score 4 4 0 0 0  Altered sleeping 1 3 0 0 -  Tired, decreased energy 2 2 1  0 -  Change in appetite 0 0 0 0 -  Feeling bad or failure about yourself  0 0 0 0 -  Trouble concentrating 0 0 0 0 -  Moving slowly or fidgety/restless 0 2 0 0 -  Suicidal thoughts 0 0 0 0 -  PHQ-9 Score 7 11 1  0 -  Difficult doing work/chores - Somewhat difficult Not difficult at all Not difficult at all -   .. GAD 7 : Generalized Anxiety Score 11/28/2019 09/26/2019 02/04/2019 01/13/2019  Nervous, Anxious, on Edge 1 3 0 0  Control/stop worrying 1 3 0 0  Worry too much - different things 2 3 0 0  Trouble relaxing 1 3 1  0  Restless 0 2 0 0  Easily annoyed or irritable 1 2 1  0  Afraid - awful might happen 0 0 0 0  Total GAD 7 Score 6 16 2  0  Anxiety Difficulty - Somewhat difficult Not difficult at all Not difficult at all         Assessment & Plan:   Monica Fry was seen today for anxiety and depression.  Diagnoses and all orders for this visit:  Depressed mood -     citalopram (CELEXA) 10 MG tablet; Take 1 tablet (10 mg total) by mouth daily.  Elevated blood pressure reading  Anxiety -     citalopram (CELEXA) 10 MG tablet; Take 1 tablet (10 mg total) by mouth daily.   Pt seems to be doing better with anxiety but depressed mood has worsened. BP up today as wel. Suspected elevated BP was due to stress/anxiety.  She is not on labetolol. She is not swelling anymore and does not take HCTZ. She is checking  BP at home and much better under 130 over 90. Discussed importance of BP control.  She is not exercising. Her job seems to be her trigger right now. Continue buspar but add celexa. Discussed side effects. Xanax only as needed and very sparingly. Continued to discuss non medication options for mood control.   DUB resolved with new OCP on new pack. Follow up as needed.   Keep BP log.   Follow up in 24months or sooner if needed.   Spent 30 minutes with patient discussing plan.

## 2019-12-01 ENCOUNTER — Encounter: Payer: Self-pay | Admitting: Physician Assistant

## 2019-12-01 DIAGNOSIS — N938 Other specified abnormal uterine and vaginal bleeding: Secondary | ICD-10-CM | POA: Insufficient documentation

## 2019-12-01 DIAGNOSIS — R4589 Other symptoms and signs involving emotional state: Secondary | ICD-10-CM | POA: Insufficient documentation

## 2020-02-04 ENCOUNTER — Encounter: Payer: Self-pay | Admitting: Physician Assistant

## 2020-02-04 ENCOUNTER — Other Ambulatory Visit: Payer: Self-pay | Admitting: Physician Assistant

## 2020-02-04 DIAGNOSIS — F439 Reaction to severe stress, unspecified: Secondary | ICD-10-CM

## 2020-02-04 DIAGNOSIS — F419 Anxiety disorder, unspecified: Secondary | ICD-10-CM

## 2020-02-08 ENCOUNTER — Encounter: Payer: Self-pay | Admitting: Physician Assistant

## 2020-02-08 DIAGNOSIS — R4589 Other symptoms and signs involving emotional state: Secondary | ICD-10-CM

## 2020-02-08 DIAGNOSIS — F419 Anxiety disorder, unspecified: Secondary | ICD-10-CM

## 2020-02-09 MED ORDER — CITALOPRAM HYDROBROMIDE 10 MG PO TABS: 10.0000 mg | ORAL_TABLET | Freq: Every day | ORAL | 0 refills | Status: DC

## 2020-02-23 ENCOUNTER — Encounter: Payer: Commercial Managed Care - PPO | Admitting: Physician Assistant

## 2020-02-25 ENCOUNTER — Ambulatory Visit (INDEPENDENT_AMBULATORY_CARE_PROVIDER_SITE_OTHER): Payer: Commercial Managed Care - PPO | Admitting: Physician Assistant

## 2020-02-25 ENCOUNTER — Encounter: Payer: Self-pay | Admitting: Physician Assistant

## 2020-02-25 ENCOUNTER — Other Ambulatory Visit: Payer: Self-pay

## 2020-02-25 VITALS — BP 140/80 | HR 86 | Ht 65.0 in | Wt 152.0 lb

## 2020-02-25 DIAGNOSIS — Z309 Encounter for contraceptive management, unspecified: Secondary | ICD-10-CM

## 2020-02-25 DIAGNOSIS — R4589 Other symptoms and signs involving emotional state: Secondary | ICD-10-CM

## 2020-02-25 DIAGNOSIS — Z13220 Encounter for screening for lipoid disorders: Secondary | ICD-10-CM

## 2020-02-25 DIAGNOSIS — Z1159 Encounter for screening for other viral diseases: Secondary | ICD-10-CM

## 2020-02-25 DIAGNOSIS — Z114 Encounter for screening for human immunodeficiency virus [HIV]: Secondary | ICD-10-CM

## 2020-02-25 DIAGNOSIS — F419 Anxiety disorder, unspecified: Secondary | ICD-10-CM | POA: Diagnosis not present

## 2020-02-25 DIAGNOSIS — I10 Essential (primary) hypertension: Secondary | ICD-10-CM

## 2020-02-25 DIAGNOSIS — Z0000 Encounter for general adult medical examination without abnormal findings: Secondary | ICD-10-CM | POA: Diagnosis not present

## 2020-02-25 DIAGNOSIS — G43001 Migraine without aura, not intractable, with status migrainosus: Secondary | ICD-10-CM

## 2020-02-25 DIAGNOSIS — Z1231 Encounter for screening mammogram for malignant neoplasm of breast: Secondary | ICD-10-CM

## 2020-02-25 DIAGNOSIS — Z131 Encounter for screening for diabetes mellitus: Secondary | ICD-10-CM

## 2020-02-25 MED ORDER — NORETHIN-ETH ESTRAD TRIPHASIC 0.5/0.75/1-35 MG-MCG PO TABS: ORAL_TABLET | ORAL | 4 refills | Status: DC

## 2020-02-25 MED ORDER — CITALOPRAM HYDROBROMIDE 10 MG PO TABS: 10.0000 mg | ORAL_TABLET | Freq: Every day | ORAL | 3 refills | Status: DC

## 2020-02-25 MED ORDER — NARATRIPTAN HCL 2.5 MG PO TABS: 2.5000 mg | ORAL_TABLET | Freq: Every day | ORAL | 5 refills | Status: DC

## 2020-02-25 NOTE — Patient Instructions (Signed)
Health Maintenance, Female Adopting a healthy lifestyle and getting preventive care are important in promoting health and wellness. Ask your health care provider about:  The right schedule for you to have regular tests and exams.  Things you can do on your own to prevent diseases and keep yourself healthy. What should I know about diet, weight, and exercise? Eat a healthy diet   Eat a diet that includes plenty of vegetables, fruits, low-fat dairy products, and lean protein.  Do not eat a lot of foods that are high in solid fats, added sugars, or sodium. Maintain a healthy weight Body mass index (BMI) is used to identify weight problems. It estimates body fat based on height and weight. Your health care provider can help determine your BMI and help you achieve or maintain a healthy weight. Get regular exercise Get regular exercise. This is one of the most important things you can do for your health. Most adults should:  Exercise for at least 150 minutes each week. The exercise should increase your heart rate and make you sweat (moderate-intensity exercise).  Do strengthening exercises at least twice a week. This is in addition to the moderate-intensity exercise.  Spend less time sitting. Even light physical activity can be beneficial. Watch cholesterol and blood lipids Have your blood tested for lipids and cholesterol at 41 years of age, then have this test every 5 years. Have your cholesterol levels checked more often if:  Your lipid or cholesterol levels are high.  You are older than 40 years of age.  You are at high risk for heart disease. What should I know about cancer screening? Depending on your health history and family history, you may need to have cancer screening at various ages. This may include screening for:  Breast cancer.  Cervical cancer.  Colorectal cancer.  Skin cancer.  Lung cancer. What should I know about heart disease, diabetes, and high blood  pressure? Blood pressure and heart disease  High blood pressure causes heart disease and increases the risk of stroke. This is more likely to develop in people who have high blood pressure readings, are of African descent, or are overweight.  Have your blood pressure checked: ? Every 3-5 years if you are 18-39 years of age. ? Every year if you are 40 years old or older. Diabetes Have regular diabetes screenings. This checks your fasting blood sugar level. Have the screening done:  Once every three years after age 40 if you are at a normal weight and have a low risk for diabetes.  More often and at a younger age if you are overweight or have a high risk for diabetes. What should I know about preventing infection? Hepatitis B If you have a higher risk for hepatitis B, you should be screened for this virus. Talk with your health care provider to find out if you are at risk for hepatitis B infection. Hepatitis C Testing is recommended for:  Everyone born from 1945 through 1965.  Anyone with known risk factors for hepatitis C. Sexually transmitted infections (STIs)  Get screened for STIs, including gonorrhea and chlamydia, if: ? You are sexually active and are younger than 41 years of age. ? You are older than 41 years of age and your health care provider tells you that you are at risk for this type of infection. ? Your sexual activity has changed since you were last screened, and you are at increased risk for chlamydia or gonorrhea. Ask your health care provider if   you are at risk.  Ask your health care provider about whether you are at high risk for HIV. Your health care provider may recommend a prescription medicine to help prevent HIV infection. If you choose to take medicine to prevent HIV, you should first get tested for HIV. You should then be tested every 3 months for as long as you are taking the medicine. Pregnancy  If you are about to stop having your period (premenopausal) and  you may become pregnant, seek counseling before you get pregnant.  Take 400 to 800 micrograms (mcg) of folic acid every day if you become pregnant.  Ask for birth control (contraception) if you want to prevent pregnancy. Osteoporosis and menopause Osteoporosis is a disease in which the bones lose minerals and strength with aging. This can result in bone fractures. If you are 65 years old or older, or if you are at risk for osteoporosis and fractures, ask your health care provider if you should:  Be screened for bone loss.  Take a calcium or vitamin D supplement to lower your risk of fractures.  Be given hormone replacement therapy (HRT) to treat symptoms of menopause. Follow these instructions at home: Lifestyle  Do not use any products that contain nicotine or tobacco, such as cigarettes, e-cigarettes, and chewing tobacco. If you need help quitting, ask your health care provider.  Do not use street drugs.  Do not share needles.  Ask your health care provider for help if you need support or information about quitting drugs. Alcohol use  Do not drink alcohol if: ? Your health care provider tells you not to drink. ? You are pregnant, may be pregnant, or are planning to become pregnant.  If you drink alcohol: ? Limit how much you use to 0-1 drink a day. ? Limit intake if you are breastfeeding.  Be aware of how much alcohol is in your drink. In the [place] one drink equals one 12 oz bottle of beer (355 mL), one 5 oz glass of wine (148 mL), or one 1 oz glass of hard liquor (44 mL). General instructions  Schedule regular health, dental, and eye exams.  Stay current with your vaccines.  Tell your health care provider if: ? You often feel depressed. ? You have ever been abused or do not feel safe at home. Summary  Adopting a healthy lifestyle and getting preventive care are important in promoting health and wellness.  Follow your health care provider's instructions about healthy  diet, exercising, and getting tested or screened for diseases.  Follow your health care provider's instructions on monitoring your cholesterol and blood pressure. This information is not intended to replace advice given to you by your health care provider. Make sure you discuss any questions you have with your health care provider. Document Revised: 01/30/2018 Document Reviewed: 01/30/2018 Elsevier Patient Education  2020 Elsevier Inc.  

## 2020-02-25 NOTE — Progress Notes (Signed)
Subjective:     Monica Fry is a 40 y.o. female and is here for a comprehensive physical exam. The patient reports no problems.  Social History   Socioeconomic History   Marital status: Married    Spouse name: Not on file   Number of children: Not on file   Years of education: Not on file   Highest education level: Not on file  Occupational History   Occupation: pharmacist    Employer: [employer]  Tobacco Use   Smoking status: Never Smoker   Smokeless tobacco: Never Used  Vaping Use   Vaping Use: Never used  Substance and Sexual Activity   Alcohol use: Yes    Comment: very socially   Drug use: No   Sexual activity: Yes    Partners: Male    Birth control/protection: Pill    Comment: husband had vasectomy  Other Topics Concern   Not on file  Social History Narrative   Not on file   Social Determinants of Health   Financial Resource Strain: Not on file  Food Insecurity: Not on file  Transportation Needs: Not on file  Physical Activity: Not on file  Stress: Not on file  Social Connections: Not on file  Intimate Partner Violence: Not on file   Health Maintenance  Topic Date Due   MAMMOGRAM  10/15/2020   PAP SMEAR-Modifier  07/28/2022   TETANUS/TDAP  11/15/2023   INFLUENZA VACCINE  Completed   COVID-19 Vaccine  Completed   Hepatitis C Screening  Completed   HIV Screening  Completed    The following portions of the patient's history were reviewed and updated as appropriate: allergies, current medications, past family history, past medical history, past social history, past surgical history and problem list.  Review of Systems A comprehensive review of systems was negative.   Objective:    BP 140/80    Pulse 86    Ht 5\' 5"  (1.651 m)    Wt 152 lb (68.9 kg)    SpO2 100%    BMI 25.29 kg/m  General appearance: alert, cooperative and appears stated age Head: Normocephalic, without obvious abnormality, atraumatic Eyes:  conjunctivae/corneas clear. PERRL, EOM's intact. Fundi benign. Ears: normal TM's and external ear canals both ears Nose: Nares normal. Septum midline. Mucosa normal. No drainage or sinus tenderness. Throat: lips, mucosa, and tongue normal; teeth and gums normal Neck: no adenopathy, no carotid bruit, no JVD, supple, symmetrical, trachea midline and thyroid not enlarged, symmetric, no tenderness/mass/nodules Back: symmetric, no curvature. ROM normal. No CVA tenderness. Lungs: clear to auscultation bilaterally Breasts: normal appearance, no masses or tenderness Heart: regular rate and rhythm, S1, S2 normal, no murmur, click, rub or gallop Abdomen: soft, non-tender; bowel sounds normal; no masses,  no organomegaly Extremities: extremities normal, atraumatic, no cyanosis or edema Pulses: 2+ and symmetric Skin: Skin color, texture, turgor normal. No rashes or lesions Lymph nodes: Cervical, supraclavicular, and axillary nodes normal. Neurologic: Alert and oriented X 3, normal strength and tone. Normal symmetric reflexes. Normal coordination and gait   . Depression screen [employer] [facility] 2/9 02/25/2020 11/28/2019 09/26/2019 02/04/2019 01/13/2019  Decreased Interest 0 2 2 0 0  Down, Depressed, Hopeless 0 2 2 0 0  PHQ - 2 Score 0 4 4 0 0  Altered sleeping 2 1 3  0 0  Tired, decreased energy 0 2 2 1  0  Change in appetite 0 0 0 0 0  Feeling bad or failure about yourself  0 0 0 0 0  Trouble  concentrating 0 0 0 0 0  Moving slowly or fidgety/restless 0 0 2 0 0  Suicidal thoughts 0 0 0 0 0  PHQ-9 Score 2 7 11 1  0  Difficult doing work/chores Not difficult at all - Somewhat difficult Not difficult at all Not difficult at all   . GAD 7 : Generalized Anxiety Score 02/25/2020 11/28/2019 09/26/2019 02/04/2019  Nervous, Anxious, on Edge 0 1 3 0  Control/stop worrying 0 1 3 0  Worry too much - different things 0 2 3 0  Trouble relaxing 0 1 3 1   Restless 0 0 2 0  Easily annoyed or irritable 0 1 2 1   Afraid - awful might  happen 0 0 0 0  Total GAD 7 Score 0 6 16 2   Anxiety Difficulty Not difficult at all - Somewhat difficult Not difficult at all     Assessment:    Healthy female exam.     Plan:  02/06/2019 Monica Fry was seen today for annual exam.  Diagnoses and all orders for this visit:  Routine physical examination -     Hepatitis C Antibody -     HIV antibody (with reflex) -     Lipid Panel w/reflex Direct LDL -     COMPLETE METABOLIC PANEL WITH GFR -     CBC  White coat syndrome with hypertension  Anxiety -     citalopram (CELEXA) 10 MG tablet; Take 1 tablet (10 mg total) by mouth daily.  Depressed mood -     citalopram (CELEXA) 10 MG tablet; Take 1 tablet (10 mg total) by mouth daily.  Migraine without aura and with status migrainosus, not intractable -     naratriptan (AMERGE) 2.5 MG tablet; Take 1 tablet (2.5 mg total) by mouth daily. Take one (1) tablet at onset of headache; if returns or does not resolve, may repeat after 4 hours; do not exceed five (5) mg in 24 hours.  Encounter for hepatitis C screening test for low risk patient -     Hepatitis C Antibody  Screening for HIV (human immunodeficiency virus) -     HIV antibody (with reflex)  Screening for diabetes mellitus -     COMPLETE METABOLIC PANEL WITH GFR  Screening for lipid disorders -     Lipid Panel w/reflex Direct LDL  Encounter for contraceptive management, unspecified type -     norethindrone-ethinyl estradiol (CYCLAFEM) 0.5/0.75/1-35 MG-MCG tablet; TAKE 1 ACTIVE PILL BY MOUTH ONCE DAILY CONTINUOUSLY    .. Discussed 150 minutes of exercise a week.  Encouraged vitamin D 1000 units and Calcium 1300mg  or 4 servings of dairy a day.  Pap up to date. Declined STI testing.  OCP refilled.  Mammogram ordered.  Fasting labs ordered.  Covid/flu/tdap UTD.    Mood doing great. Refilled celexa.  See After Visit Summary for Counseling Recommendations

## 2020-02-26 LAB — COMPLETE METABOLIC PANEL WITH GFR
AG Ratio: 1.6 (calc) (ref 1.0–2.5)
ALT: 9 U/L (ref 6–29)
AST: 14 U/L (ref 10–30)
Albumin: 4.2 g/dL (ref 3.6–5.1)
Alkaline phosphatase (APISO): 45 U/L (ref 31–125)
BUN: 13 mg/dL (ref 7–25)
CO2: 27 mmol/L (ref 20–32)
Calcium: 9.5 mg/dL (ref 8.6–10.2)
Chloride: 102 mmol/L (ref 98–110)
Creat: 0.98 mg/dL (ref 0.50–1.10)
GFR, Est African American: 84 mL/min/{1.73_m2} (ref >=60)
GFR, Est Non African American: 72 mL/min/{1.73_m2} (ref >=60)
Globulin: 2.7 g/dL (calc) (ref 1.9–3.7)
Glucose, Bld: 82 mg/dL (ref 65–99)
Potassium: 3.8 mmol/L (ref 3.5–5.3)
Sodium: 138 mmol/L (ref 135–146)
Total Bilirubin: 0.5 mg/dL (ref 0.2–1.2)
Total Protein: 6.9 g/dL (ref 6.1–8.1)

## 2020-02-26 LAB — LIPID PANEL W/REFLEX DIRECT LDL
Cholesterol: 213 mg/dL — ABNORMAL HIGH (ref <200)
HDL: 64 mg/dL (ref >=50)
LDL Cholesterol (Calc): 120 mg/dL (calc) — ABNORMAL HIGH
Non-HDL Cholesterol (Calc): 149 mg/dL (calc) — ABNORMAL HIGH (ref <130)
Total CHOL/HDL Ratio: 3.3 (calc) (ref <5.0)
Triglycerides: 176 mg/dL — ABNORMAL HIGH (ref <150)

## 2020-02-26 LAB — CBC
HCT: 43.7 % (ref 35.0–45.0)
Hemoglobin: 14.8 g/dL (ref 11.7–15.5)
MCH: 30.8 pg (ref 27.0–33.0)
MCHC: 33.9 g/dL (ref 32.0–36.0)
MCV: 90.9 fL (ref 80.0–100.0)
MPV: 10.4 fL (ref 7.5–12.5)
Platelets: 238 10*3/uL (ref 140–400)
RBC: 4.81 10*6/uL (ref 3.80–5.10)
RDW: 12 % (ref 11.0–15.0)
WBC: 7 10*3/uL (ref 3.8–10.8)

## 2020-02-26 LAB — HEPATITIS C ANTIBODY
Hepatitis C Ab: NONREACTIVE
SIGNAL TO CUT-OFF: 0 (ref <1.00)

## 2020-02-26 LAB — HIV ANTIBODY (ROUTINE TESTING W REFLEX): HIV 1&2 Ab, 4th Generation: NONREACTIVE

## 2020-02-27 NOTE — Progress Notes (Signed)
Monica Fry,   HDL great. LDL up some. Watch your processed fatty foods. For age and risk factors cholesterol ok.  Kidney, liver, glucose look great.  Hemoglobin perfect.

## 2020-04-19 ENCOUNTER — Encounter: Payer: Self-pay | Admitting: Physician Assistant

## 2020-04-20 MED ORDER — ONDANSETRON HCL 8 MG PO TABS: 8.0000 mg | ORAL_TABLET | Freq: Three times a day (TID) | ORAL | 2 refills | Status: DC | PRN

## 2020-04-20 MED ORDER — CYCLOBENZAPRINE HCL 10 MG PO TABS: 10.0000 mg | ORAL_TABLET | ORAL | 2 refills | Status: DC | PRN

## 2020-04-20 NOTE — Telephone Encounter (Signed)
These are both "historical meds". Looks like they have not been prescribed by you. Please advise.

## 2020-05-04 ENCOUNTER — Other Ambulatory Visit: Payer: Self-pay | Admitting: Physician Assistant

## 2020-05-04 DIAGNOSIS — F419 Anxiety disorder, unspecified: Secondary | ICD-10-CM

## 2020-05-04 DIAGNOSIS — F439 Reaction to severe stress, unspecified: Secondary | ICD-10-CM

## 2020-05-17 ENCOUNTER — Other Ambulatory Visit: Payer: Self-pay

## 2020-05-17 ENCOUNTER — Ambulatory Visit (INDEPENDENT_AMBULATORY_CARE_PROVIDER_SITE_OTHER): Payer: Commercial Managed Care - PPO | Admitting: Obstetrics & Gynecology

## 2020-05-17 ENCOUNTER — Encounter: Payer: Self-pay | Admitting: Obstetrics & Gynecology

## 2020-05-17 VITALS — BP 143/100 | HR 93 | Ht 62.0 in | Wt 153.0 lb

## 2020-05-17 DIAGNOSIS — N939 Abnormal uterine and vaginal bleeding, unspecified: Secondary | ICD-10-CM | POA: Diagnosis not present

## 2020-05-17 DIAGNOSIS — I10 Essential (primary) hypertension: Secondary | ICD-10-CM

## 2020-05-17 DIAGNOSIS — N938 Other specified abnormal uterine and vaginal bleeding: Secondary | ICD-10-CM | POA: Diagnosis not present

## 2020-05-17 LAB — POCT URINE PREGNANCY: Preg Test, Ur: NEGATIVE

## 2020-05-17 NOTE — Progress Notes (Signed)
   Subjective:    Patient ID: Monica Fry, female    DOB: [birth year], 40 y.o.   MRN: [id number]  HPI  40 yo female has been having AUB.  Pt has been having stressful life.  Dad in hospital and then rehab.   Spotting began (on continuous OCPS) which is not uncommon First week of march had large hemorrhage with clots, soaked through to chair.  This happened again the next week.  Pt stopped birth control, spotted a few days.  Not bleeding today.  No pain with bleeding episodes.    Review of Systems  Constitutional: Negative.   Respiratory: Negative.   Cardiovascular: Negative.   Gastrointestinal: Positive for constipation.  Genitourinary: Positive for menstrual problem and vaginal bleeding. Negative for pelvic pain.  Psychiatric/Behavioral: Negative.        Objective:   Physical Exam Vitals reviewed.  Constitutional:      General: She is not in acute distress.    Appearance: She is well-developed.  HENT:     Head: Normocephalic and atraumatic.  Eyes:     Conjunctiva/sclera: Conjunctivae normal.  Cardiovascular:     Rate and Rhythm: Normal rate.  Pulmonary:     Effort: Pulmonary effort is normal.  Genitourinary:    Comments: Tanner V Vulva:  No lesion Vagina:  Pink, no lesions, no discharge, no blood Cervix:  No CMT Uterus:  Non tender, mobile Right adnexa--non tender, no mass Left adnexa--non tender, no mass  Skin:    General: Skin is warm and dry.  Neurological:     Mental Status: She is alert and oriented to person, place, and time.    Vitals:   05/17/20 1337  BP: (!) 143/100  Pulse: 93  Weight: 153 lb (69.4 kg)  Height: 5\' 2"  (1.575 m)      Assessment & Plan:  40 yo female with AUB on continuous OCPs.  1.  Exam benign 2.  UPT negative 3.  Complete pelvic 41 with TVUS 4.  Restart OCPs in 2 weeks--history of endometriosis pain which is controlled with OCPs. 5.  White Coat HTN (takes BP at work and nml)

## 2020-06-07 ENCOUNTER — Ambulatory Visit
Admission: RE | Admit: 2020-06-07 | Discharge: 2020-06-07 | Disposition: A | Discharge status: Home / Self Care | Payer: Commercial Managed Care - PPO | Source: Ambulatory Visit | Attending: Obstetrics & Gynecology | Admitting: Obstetrics & Gynecology

## 2020-06-07 ENCOUNTER — Other Ambulatory Visit: Payer: Self-pay

## 2020-06-07 DIAGNOSIS — N939 Abnormal uterine and vaginal bleeding, unspecified: Secondary | ICD-10-CM | POA: Diagnosis present

## 2020-07-21 ENCOUNTER — Encounter: Payer: Self-pay | Admitting: Physician Assistant

## 2020-07-22 NOTE — Telephone Encounter (Signed)
Patient has been scheduled. AM 

## 2020-07-23 ENCOUNTER — Encounter: Payer: Self-pay | Admitting: Physician Assistant

## 2020-09-15 ENCOUNTER — Ambulatory Visit (INDEPENDENT_AMBULATORY_CARE_PROVIDER_SITE_OTHER): Payer: Commercial Managed Care - PPO

## 2020-09-15 ENCOUNTER — Other Ambulatory Visit: Payer: Self-pay

## 2020-09-15 ENCOUNTER — Ambulatory Visit (INDEPENDENT_AMBULATORY_CARE_PROVIDER_SITE_OTHER): Payer: Commercial Managed Care - PPO | Admitting: Sports Medicine

## 2020-09-15 DIAGNOSIS — M25552 Pain in left hip: Secondary | ICD-10-CM | POA: Diagnosis not present

## 2020-09-15 DIAGNOSIS — G8929 Other chronic pain: Secondary | ICD-10-CM

## 2020-09-15 MED ORDER — MELOXICAM 15 MG PO TABS: ORAL_TABLET | ORAL | 3 refills | Status: DC

## 2020-09-15 NOTE — Progress Notes (Signed)
    Procedures performed today:    None.  Independent interpretation of notes and tests performed by another provider:   None.  Brief History, Exam, Impression, and Recommendations:    Chronic left iliac crest pain This is a very pleasant 40 year old female, for 2 months now she has had pain that she localizes over her left iliac crest, no history of trauma, no associated GI symptoms, no change in physical activity, often worse with sitting down for long periods of time. On exam she has tenderness at the left iliac crest, no tenderness at the SI joint, lumbar spine. Nothing overtly radicular, no constitutional symptoms. Hip exam is unremarkable. We will get x-rays of her lumbar spine, left iliac crest, add meloxicam, home conditioning exercises. Return to see me in approximately 3 weeks where we will likely get an MRI if not significantly better, there is a good chance that this pain is referred from her lumbar spine.    ___________________________________________ Ihor Austin. Benjamin Stain, M.D., ABFM., CAQSM. Primary Care and Sports Medicine [facility]  Adjunct Instructor of Family Medicine  [employer]

## 2020-09-15 NOTE — Assessment & Plan Note (Signed)
This is a very pleasant 40 year old female, for 2 months now she has had pain that she localizes over her left iliac crest, no history of trauma, no associated GI symptoms, no change in physical activity, often worse with sitting down for long periods of time. On exam she has tenderness at the left iliac crest, no tenderness at the SI joint, lumbar spine. Nothing overtly radicular, no constitutional symptoms. Hip exam is unremarkable. We will get x-rays of her lumbar spine, left iliac crest, add meloxicam, home conditioning exercises. Return to see me in approximately 3 weeks where we will likely get an MRI if not significantly better, there is a good chance that this pain is referred from her lumbar spine.

## 2020-10-04 ENCOUNTER — Other Ambulatory Visit: Payer: Self-pay | Admitting: *Deleted

## 2020-10-04 DIAGNOSIS — Z1231 Encounter for screening mammogram for malignant neoplasm of breast: Secondary | ICD-10-CM

## 2020-10-07 ENCOUNTER — Ambulatory Visit: Payer: Commercial Managed Care - PPO | Admitting: Sports Medicine

## 2020-11-03 NOTE — Progress Notes (Signed)
GYNECOLOGY ANNUAL PREVENTATIVE CARE ENCOUNTER NOTE  History:     Monica Fry is a 40 y.o. G44P2002 female here for a routine annual gynecologic exam.  Current complaints: none.     Had some episodes of AUB in February of this year. Had bloodwork and pelvic US which were normal. Since that  time she has had no issues. She has BTB easily but this was more than usual for her. She doesn't mind the BTB when it is light.   Denies abnormal vaginal bleeding, discharge, pelvic pain, problems with intercourse or other gynecologic concerns.    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 She is married. She has 2 daughters.   Gynecologic History No LMP recorded. (Menstrual status: Oral contraceptives). Contraception: vasectomy Last Pap: 07/2019. Result was normal with negative HPV Last Mammogram: 11/03/20.  Result is pending   Obstetric History OB History  Gravida Para Term Preterm AB Living  2 2 2     2   SAB IAB Ectopic Multiple Live Births          2    # Outcome Date GA Lbr Len/2nd Weight Sex Delivery Anes PTL Lv  2 Term 10/26/09 [redacted]w[redacted]d 05:00 7 lb 9 oz (3.43 kg) F Vag-Spont EPI  LIV  1 Term 06/27/07 [redacted]w[redacted]d 12:00 7 lb 11 oz (3.487 kg) F Vag-Spont EPI  LIV    Past Medical History:  Diagnosis Date   Abnormal Pap smear of cervix 2010   Anemia     Past Surgical History:  Procedure Laterality Date   CRYOTHERAPY     LAPAROSCOPY  2004   exploratory for endometriosis    Current Outpatient Medications on File Prior to Visit  Medication Sig Dispense Refill   ALPRAZolam (XANAX) 0.5 MG tablet Take 1/2 (0.25mg ) to 1 (0.5mg ) tab up to three times per day as needed for severe anxiety and panic. 40 tablet 1   busPIRone (BUSPAR) 5 MG tablet TAKE 1 TABLET BY MOUTH THREE TIMES A DAY 270 tablet 1   cetirizine (ZYRTEC) 10 MG tablet Take 10 mg by mouth daily.     cholecalciferol (VITAMIN D3) 25 MCG (1000 UT) tablet Take 1,000 Units by mouth daily.     citalopram (CELEXA) 10 MG tablet Take 1  tablet (10 mg total) by mouth daily. 90 tablet 3   cyclobenzaprine (FLEXERIL) 10 MG tablet Take 1 tablet (10 mg total) by mouth as needed for muscle spasms. 30 tablet 2   fluticasone (FLONASE) 50 MCG/ACT nasal spray Place 2 sprays into the nose as needed. Reported on 07/15/2015     meloxicam (MOBIC) 15 MG tablet One tab PO qAM with a meal for 2 weeks, then daily prn pain. 30 tablet 3   Multiple Vitamin (MULTIVITAMIN ADULT PO) Take by mouth.     mupirocin ointment (BACTROBAN) 2 % Apply 1 application topically 3 (three) times daily. Apply to affected area for 7-10 days. 30 g 3   naratriptan (AMERGE) 2.5 MG tablet Take 1 tablet (2.5 mg total) by mouth daily. Take one (1) tablet at onset of headache; if returns or does not resolve, may repeat after 4 hours; do not exceed five (5) mg in 24 hours. 9 tablet 5   norethindrone-ethinyl estradiol (CYCLAFEM) 0.5/0.75/1-35 MG-MCG tablet TAKE 1 ACTIVE PILL BY MOUTH ONCE DAILY CONTINUOUSLY 84 tablet 4   ondansetron (ZOFRAN) 8 MG tablet Take 1 tablet (8 mg total) by mouth every 8 (eight) hours as needed for nausea or vomiting. 20 tablet  2   No current facility-administered medications on file prior to visit.    No Known Allergies  Social History:  reports that she has never smoked. She has never used smokeless tobacco. She reports current alcohol use. She reports that she does not use drugs.  Family History  Problem Relation Age of Onset   Diabetes Father    Heart disease Father    Hypertension Father    Hyperlipidemia Father    Cancer Paternal Grandmother        breast and lung   Thrombosis Maternal Grandmother    Depression Maternal Grandmother    Hyperlipidemia Maternal Grandmother    Hypertension Maternal Grandmother    Breast cancer Maternal Grandmother    Heart attack Paternal Grandfather    Hyperlipidemia Paternal Grandfather    Hypertension Paternal Grandfather     The following portions of the patient's history were reviewed and updated as  appropriate: allergies, current medications, past family history, past medical history, past social history, past surgical history and problem list.  Review of Systems Pertinent items noted in HPI and remainder of comprehensive ROS otherwise negative.  Physical Exam:  There were no vitals taken for this visit. CONSTITUTIONAL: Well-developed, well-nourished female in no acute distress.  HENT:  Normocephalic, atraumatic, External right and left ear normal.  EYES: Conjunctivae and EOM are normal. Pupils are equal, round, and reactive to light. No scleral icterus.  NECK: Normal range of motion, supple, no masses.  Normal thyroid.  SKIN: Skin is warm and dry. No rash noted. Not diaphoretic. No erythema. No pallor. MUSCULOSKELETAL: Normal range of motion. No tenderness.  No cyanosis, clubbing, or edema. NEUROLOGIC: Alert and oriented to person, place, and time. Normal reflexes, muscle tone coordination.  PSYCHIATRIC: Normal mood and affect. Normal behavior. Normal judgment and thought content.  CARDIOVASCULAR: Normal heart rate noted, regular rhythm RESPIRATORY: Clear to auscultation bilaterally. Effort and breath sounds normal, no problems with respiration noted.  BREASTS: Symmetric in size. No masses, tenderness, skin changes, nipple drainage, or lymphadenopathy bilaterally. Performed in the presence of a chaperone. ABDOMEN: Soft, no distention noted.  No tenderness, rebound or guarding.  PELVIC: Vagina normal without discharge, Urethra without abnormality or discharge, no bladder tenderness, cervix normal in appearance, no CMT, uterus normal size, shape, and consistency, no adnexal masses or tenderness. Performed in the presence of a chaperone.    Assessment and Plan:  Monica Fry was seen today for annual exam.  Diagnoses and all orders for this visit:  Encounter for annual routine gynecological examination - Cervical cancer screening: Discussed guidelines. Pap with HPV done  07/2019 -  Gardasil:  Did not discuss - STD Testing: Not indicated - Birth Control: Discussed options and their risks, benefits and common side effects; discussed VTE with estrogen containing options. Desires: POPs - switched from combined OCPs due to migraine with aura. Discussed risk of stroke. She agrees with plan.  - Breast Health: Encouraged self breast awareness/SBE. Teaching provided. Discussed limits of clinical breast exam for detecting breast cancer. Rx given for MXR - F/U 12 months and prn   Encounter for screening for malignant neoplasm of breast, unspecified screening modality - Done today   Routine preventative health maintenance measures emphasized. Please refer to After Visit Summary for other counseling recommendations.      Milas Hock, MD, FACOG Obstetrician & Gynecologist, [facility] for [facility], [facility]

## 2020-11-04 ENCOUNTER — Ambulatory Visit (INDEPENDENT_AMBULATORY_CARE_PROVIDER_SITE_OTHER): Payer: Commercial Managed Care - PPO

## 2020-11-04 ENCOUNTER — Ambulatory Visit (INDEPENDENT_AMBULATORY_CARE_PROVIDER_SITE_OTHER): Payer: Commercial Managed Care - PPO | Admitting: Obstetrics and Gynecology

## 2020-11-04 ENCOUNTER — Other Ambulatory Visit: Payer: Self-pay

## 2020-11-04 ENCOUNTER — Encounter: Payer: Self-pay | Admitting: Obstetrics and Gynecology

## 2020-11-04 VITALS — BP 148/92 | HR 74 | Ht 62.0 in | Wt 160.0 lb

## 2020-11-04 DIAGNOSIS — Z01419 Encounter for gynecological examination (general) (routine) without abnormal findings: Secondary | ICD-10-CM

## 2020-11-04 DIAGNOSIS — Z1231 Encounter for screening mammogram for malignant neoplasm of breast: Secondary | ICD-10-CM | POA: Diagnosis not present

## 2020-11-04 DIAGNOSIS — Z1239 Encounter for other screening for malignant neoplasm of breast: Secondary | ICD-10-CM | POA: Diagnosis not present

## 2020-11-04 MED ORDER — NORETHINDRONE 0.35 MG PO TABS: 1.0000 | ORAL_TABLET | Freq: Every day | ORAL | 3 refills | Status: DC

## 2020-11-09 NOTE — Progress Notes (Signed)
Normal mammogram. Follow up in 1 year.

## 2020-11-15 ENCOUNTER — Ambulatory Visit (INDEPENDENT_AMBULATORY_CARE_PROVIDER_SITE_OTHER): Payer: Commercial Managed Care - PPO | Admitting: Physician Assistant

## 2020-11-15 ENCOUNTER — Encounter: Payer: Self-pay | Admitting: Physician Assistant

## 2020-11-15 VITALS — BP 138/88 | HR 89 | Ht 62.0 in | Wt 157.0 lb

## 2020-11-15 DIAGNOSIS — R4589 Other symptoms and signs involving emotional state: Secondary | ICD-10-CM | POA: Diagnosis not present

## 2020-11-15 DIAGNOSIS — F419 Anxiety disorder, unspecified: Secondary | ICD-10-CM | POA: Diagnosis not present

## 2020-11-15 DIAGNOSIS — R030 Elevated blood-pressure reading, without diagnosis of hypertension: Secondary | ICD-10-CM | POA: Diagnosis not present

## 2020-11-15 DIAGNOSIS — F410 Panic disorder [episodic paroxysmal anxiety] without agoraphobia: Secondary | ICD-10-CM | POA: Diagnosis not present

## 2020-11-15 MED ORDER — CITALOPRAM HYDROBROMIDE 10 MG PO TABS: 10.0000 mg | ORAL_TABLET | Freq: Every day | ORAL | 3 refills | Status: DC

## 2020-11-15 MED ORDER — ALPRAZOLAM 0.5 MG PO TABS: ORAL_TABLET | ORAL | 1 refills | Status: DC

## 2020-11-15 NOTE — Progress Notes (Signed)
Subjective:    Patient ID: Monica Fry, female    DOB: 1981/01/03, 40 y.o.   MRN: [id number]  HPI Pt is a 40 yo female with depressed mood and anxiety with some panic attacks who presents to the clinic for medication follow up. She was doing great and decided she would go off celexa. It has been 2 weeks and her family and co-workers have noticed. She has too. She is much more anxious, irritable and overwhelmed. She would like to go back on it. She does not feel like buspar helps and not taking that. Xanax she uses only as needed a few times a month for panic attacks. She knows her BP spikes with her anxiety. No CP, palpitations, headaches or vision changes. No SI/HC.   Marland Kitchen. 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   White coat syndrome with hypertension 04/10/2016   Migraine with aura and with status migrainosus, not intractable 02/08/2018   Estrogen increased 02/04/2019   Elevated blood pressure reading 10/13/2019   Stress 10/13/2019   Anxiety 10/13/2019   Urticaria 10/13/2019   Tachycardia 10/20/2019   Localized swelling of both hands 10/20/2019   Depressed mood 12/01/2019   DUB (dysfunctional uterine bleeding) 12/01/2019   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   Panic attacks 11/16/2020   Resolved Ambulatory Problems    Diagnosis Date Noted   Closed fracture of fifth metatarsal bone of left foot 04/28/2013   Oral contraceptive use 01/08/2014   Bursitis of left shoulder 07/15/2015   Past Medical History:  Diagnosis Date   Abnormal Pap smear of cervix 2010   Anemia        Review of Systems  All other systems reviewed and are negative.     Objective:   Physical Exam Vitals reviewed.  Constitutional:       Appearance: Normal appearance.  HENT:     Head: Normocephalic.  Cardiovascular:     Rate and Rhythm: Normal rate and regular rhythm.     Pulses: Normal pulses.     Heart sounds: Normal heart sounds.  Pulmonary:     Effort: Pulmonary effort is normal.     Breath sounds: Normal breath sounds.  Neurological:     General: No focal deficit present.     Mental Status: She is alert and oriented to person, place, and time.  Psychiatric:        Mood and Affect: Mood normal.         .. Depression screen [facility] 2/9 11/15/2020 02/25/2020 11/28/2019 09/26/2019 02/04/2019  Decreased Interest 1 0 2 2 0  Down, Depressed, Hopeless 1 0 2 2 0  PHQ - 2 Score 2 0 4 4 0  Altered sleeping 1 2 1 3  0  Tired, decreased energy 1 0 2 2 1   Change in appetite 0 0 0 0 0  Feeling bad or failure about yourself  0 0 0 0 0  Trouble concentrating 0 0 0 0 0  Moving slowly or fidgety/restless 0 0 0 2 0  Suicidal thoughts 0 0 0 0 0  PHQ-9 Score 4 2 7 11 1   Difficult doing work/chores Somewhat difficult Not difficult at all - Somewhat difficult Not difficult at all   . GAD 7 : Generalized  Anxiety Score 11/15/2020 02/25/2020 11/28/2019 09/26/2019  Nervous, Anxious, on Edge 1 0 1 3  Control/stop worrying 0 0 1 3  Worry too much - different things 0 0 2 3  Trouble relaxing 0 0 1 3  Restless 0 0 0 2  Easily annoyed or irritable 1 0 1 2  Afraid - awful might happen 0 0 0 0  Total GAD 7 Score 2 0 6 16  Anxiety Difficulty Not difficult at all Not difficult at all - Somewhat difficult     Assessment & Plan:  Marland KitchenMarland KitchenCandace was seen today for follow-up.  Diagnoses and all orders for this visit:  Depressed mood -     citalopram (CELEXA) 10 MG tablet; Take 1 tablet (10 mg total) by mouth daily.  Anxiety -     citalopram (CELEXA) 10 MG tablet; Take 1 tablet (10 mg total) by mouth daily.  Panic attacks -     ALPRAZolam (XANAX) 0.5 MG tablet; Take 1/2 (0.25mg ) to 1 (0.5mg ) tab up to three times per day as needed for severe  anxiety and panic.  Elevated blood pressure reading   PHQ/GAD numbers up because she decided to stop medication. She can feel the difference and is going to go back on celexa and very as needed xanax. She is not using buspar and really does not feel any benefit. Ok to stop. Discussed other ways to manage anxiety.   BP up today initially. 2nd recheck went down. Discussed to continue to watch. Will re-evaluate at CPE in a few months. Pt states her BP is linked to her stress and anxiety. Hopefully getting back on medications will help her with both.

## 2020-11-16 DIAGNOSIS — F410 Panic disorder [episodic paroxysmal anxiety] without agoraphobia: Secondary | ICD-10-CM | POA: Insufficient documentation

## 2020-12-22 ENCOUNTER — Telehealth (INDEPENDENT_AMBULATORY_CARE_PROVIDER_SITE_OTHER): Payer: Commercial Managed Care - PPO | Admitting: Medical-Surgical

## 2020-12-22 ENCOUNTER — Encounter: Payer: Self-pay | Admitting: Medical-Surgical

## 2020-12-22 DIAGNOSIS — R051 Acute cough: Secondary | ICD-10-CM | POA: Diagnosis not present

## 2020-12-22 MED ORDER — HYDROCODONE BIT-HOMATROP MBR 5-1.5 MG/5ML PO SOLN
5.0000 mL | Freq: Three times a day (TID) | ORAL | 0 refills | Status: DC | PRN
Start: 2020-12-22 — End: 2021-02-25

## 2020-12-22 NOTE — Progress Notes (Signed)
Dry cough, coughing so much throat hurts Talked to tele med over the weekend, On day 3 of Z pack Can't take much prednisone, stomach upset

## 2020-12-22 NOTE — Progress Notes (Signed)
Virtual Visit via Video Note  I connected with Monica Fry on 12/22/20 at  1:00 PM EDT by a video enabled telemedicine application and verified that I am speaking with the correct person using two identifiers.   I discussed the limitations of evaluation and management by telemedicine and the availability of in person appointments. The patient expressed understanding and agreed to proceed.  Patient location: home Provider locations: office  Subjective:    CC: Sinus congestion, cough  HPI: Pleasant 40 year old female presenting today with reports of severe cough.  On Thursday of last week, she developed upper respiratory symptoms.  Over the weekend, she did a televisit and was prescribed a Z-Pak.  She is currently on her third day of the Z-Pak and is taking prednisone although she cannot take the full dose of prednisone due to GI upset.  Notes that her other symptoms have started to improve however she does continue to have a dry hacking cough that is worse in the evening and interfering with sleep.  Denies fever, chills, chest pain, shortness of breath, and GI symptoms.  Past medical history, Surgical history, Family history not pertinant except as noted below, Social history, Allergies, and medications have been entered into the medical record, reviewed, and corrections made.   Review of Systems: See HPI for pertinent positives and negatives.   Objective:    General: Speaking clearly in complete sentences without any shortness of breath.  Alert and oriented x3.  Normal judgment. No apparent acute distress.  Impression and Recommendations:    1. Acute cough Since Tessalon Perles are not helpful for her, recommend using Delsym cough syrup during the day.  Sending in Hycodan cough syrup for use at night and up to 3 times daily.  Patient originally requested a refill on the Hycodan because she does have prolonged cough and respiratory symptoms when she gets sick.  Unfortunately, the  medication is a schedule II drug and we are not allowed to send refills.  Advised patient to notify us if her symptoms do not resolve with the first prescription and then we will address it from there.  I discussed the assessment and treatment plan with the patient. The patient was provided an opportunity to ask questions and all were answered. The patient agreed with the plan and demonstrated an understanding of the instructions.   The patient was advised to call back or seek an in-person evaluation if the symptoms worsen or if the condition fails to improve as anticipated.  20 minutes of non-face-to-face time was provided during this encounter.  Return if symptoms worsen or fail to improve.  Thayer Ohm, DNP, APRN, FNP-BC [facility]

## 2021-02-09 ENCOUNTER — Other Ambulatory Visit: Payer: Self-pay | Admitting: Physician Assistant

## 2021-02-25 ENCOUNTER — Other Ambulatory Visit: Payer: Self-pay

## 2021-02-25 ENCOUNTER — Ambulatory Visit (INDEPENDENT_AMBULATORY_CARE_PROVIDER_SITE_OTHER): Payer: Commercial Managed Care - PPO | Admitting: Physician Assistant

## 2021-02-25 ENCOUNTER — Encounter: Payer: Self-pay | Admitting: Physician Assistant

## 2021-02-25 VITALS — BP 158/108 | HR 87 | Ht 62.0 in | Wt 158.4 lb

## 2021-02-25 DIAGNOSIS — Z1329 Encounter for screening for other suspected endocrine disorder: Secondary | ICD-10-CM

## 2021-02-25 DIAGNOSIS — Z8249 Family history of ischemic heart disease and other diseases of the circulatory system: Secondary | ICD-10-CM

## 2021-02-25 DIAGNOSIS — I10 Essential (primary) hypertension: Secondary | ICD-10-CM

## 2021-02-25 DIAGNOSIS — Z13220 Encounter for screening for lipoid disorders: Secondary | ICD-10-CM

## 2021-02-25 DIAGNOSIS — Z0000 Encounter for general adult medical examination without abnormal findings: Secondary | ICD-10-CM | POA: Diagnosis not present

## 2021-02-25 DIAGNOSIS — Z131 Encounter for screening for diabetes mellitus: Secondary | ICD-10-CM | POA: Diagnosis not present

## 2021-02-25 DIAGNOSIS — F410 Panic disorder [episodic paroxysmal anxiety] without agoraphobia: Secondary | ICD-10-CM

## 2021-02-25 DIAGNOSIS — R011 Cardiac murmur, unspecified: Secondary | ICD-10-CM

## 2021-02-25 DIAGNOSIS — R0789 Other chest pain: Secondary | ICD-10-CM

## 2021-02-25 MED ORDER — HYDROCHLOROTHIAZIDE 12.5 MG PO TABS: 12.5000 mg | ORAL_TABLET | Freq: Every day | ORAL | 0 refills | Status: DC

## 2021-02-25 MED ORDER — ALPRAZOLAM 0.5 MG PO TABS: ORAL_TABLET | ORAL | 1 refills | Status: DC

## 2021-02-25 MED ORDER — ONDANSETRON HCL 8 MG PO TABS: 8.0000 mg | ORAL_TABLET | Freq: Three times a day (TID) | ORAL | 2 refills | Status: DC | PRN

## 2021-02-25 MED ORDER — CYCLOBENZAPRINE HCL 10 MG PO TABS: 10.0000 mg | ORAL_TABLET | ORAL | 2 refills | Status: DC | PRN

## 2021-02-25 NOTE — Patient Instructions (Addendum)
Order stress test and echo  Health Maintenance, Female Adopting a healthy lifestyle and getting preventive care are important in promoting health and wellness. Ask your health care provider about: The right schedule for you to have regular tests and exams. Things you can do on your own to prevent diseases and keep yourself healthy. What should I know about diet, weight, and exercise? Eat a healthy diet  Eat a diet that includes plenty of vegetables, fruits, low-fat dairy products, and lean protein. Do not eat a lot of foods that are high in solid fats, added sugars, or sodium. Maintain a healthy weight Body mass index (BMI) is used to identify weight problems. It estimates body fat based on height and weight. Your health care provider can help determine your BMI and help you achieve or maintain a healthy weight. Get regular exercise Get regular exercise. This is one of the most important things you can do for your health. Most adults should: Exercise for at least 150 minutes each week. The exercise should increase your heart rate and make you sweat (moderate-intensity exercise). Do strengthening exercises at least twice a week. This is in addition to the moderate-intensity exercise. Spend less time sitting. Even light physical activity can be beneficial. Watch cholesterol and blood lipids Have your blood tested for lipids and cholesterol at 42 years of age, then have this test every 5 years. Have your cholesterol levels checked more often if: Your lipid or cholesterol levels are high. You are older than 42 years of age. You are at high risk for heart disease. What should I know about cancer screening? Depending on your health history and family history, you may need to have cancer screening at various ages. This may include screening for: Breast cancer. Cervical cancer. Colorectal cancer. Skin cancer. Lung cancer. What should I know about heart disease, diabetes, and high blood  pressure? Blood pressure and heart disease High blood pressure causes heart disease and increases the risk of stroke. This is more likely to develop in people who have high blood pressure readings or are overweight. Have your blood pressure checked: Every 3-5 years if you are 64-23 years of age. Every year if you are 12 years old or older. Diabetes Have regular diabetes screenings. This checks your fasting blood sugar level. Have the screening done: Once every three years after age 44 if you are at a normal weight and have a low risk for diabetes. More often and at a younger age if you are overweight or have a high risk for diabetes. What should I know about preventing infection? Hepatitis B If you have a higher risk for hepatitis B, you should be screened for this virus. Talk with your health care provider to find out if you are at risk for hepatitis B infection. Hepatitis C Testing is recommended for: Everyone born from [birth year] through 1966. Anyone with known risk factors for hepatitis C. Sexually transmitted infections (STIs) Get screened for STIs, including gonorrhea and chlamydia, if: You are sexually active and are younger than 42 years of age. You are older than 42 years of age and your health care provider tells you that you are at risk for this type of infection. Your sexual activity has changed since you were last screened, and you are at increased risk for chlamydia or gonorrhea. Ask your health care provider if you are at risk. Ask your health care provider about whether you are at high risk for HIV. Your health care provider may recommend a  prescription medicine to help prevent HIV infection. If you choose to take medicine to prevent HIV, you should first get tested for HIV. You should then be tested every 3 months for as long as you are taking the medicine. Pregnancy If you are about to stop having your period (premenopausal) and you may become pregnant, seek counseling before you  get pregnant. Take 400 to 800 micrograms (mcg) of folic acid every day if you become pregnant. Ask for birth control (contraception) if you want to prevent pregnancy. Osteoporosis and menopause Osteoporosis is a disease in which the bones lose minerals and strength with aging. This can result in bone fractures. If you are 32 years old or older, or if you are at risk for osteoporosis and fractures, ask your health care provider if you should: Be screened for bone loss. Take a calcium or vitamin D supplement to lower your risk of fractures. Be given hormone replacement therapy (HRT) to treat symptoms of menopause. Follow these instructions at home: Alcohol use Do not drink alcohol if: Your health care provider tells you not to drink. You are pregnant, may be pregnant, or are planning to become pregnant. If you drink alcohol: Limit how much you have to: 0-1 drink a day. Know how much alcohol is in your drink. In the [place] one drink equals one 12 oz bottle of beer (355 mL), one 5 oz glass of wine (148 mL), or one 1 oz glass of hard liquor (44 mL). Lifestyle Do not use any products that contain nicotine or tobacco. These products include cigarettes, chewing tobacco, and vaping devices, such as e-cigarettes. If you need help quitting, ask your health care provider. Do not use street drugs. Do not share needles. Ask your health care provider for help if you need support or information about quitting drugs. General instructions Schedule regular health, dental, and eye exams. Stay current with your vaccines. Tell your health care provider if: You often feel depressed. You have ever been abused or do not feel safe at home. Summary Adopting a healthy lifestyle and getting preventive care are important in promoting health and wellness. Follow your health care provider's instructions about healthy diet, exercising, and getting tested or screened for diseases. Follow your health care provider's  instructions on monitoring your cholesterol and blood pressure. This information is not intended to replace advice given to you by your health care provider. Make sure you discuss any questions you have with your health care provider. Document Revised: 06/28/2020 Document Reviewed: 06/28/2020 Elsevier Patient Education  2022 [employer].

## 2021-02-25 NOTE — Progress Notes (Signed)
Subjective:     Monica Fry is a 41 y.o. female and is here for a comprehensive physical exam. The patient reports no problems.  Social History   Socioeconomic History   Marital status: Married    Spouse name: Not on file   Number of children: 2   Years of education: Not on file   Highest education level: Not on file  Occupational History   Occupation: pharmacist    Employer: [employer]  Tobacco Use   Smoking status: Never   Smokeless tobacco: Never  Vaping Use   Vaping Use: Never used  Substance and Sexual Activity   Alcohol use: Yes    Comment: very socially   Drug use: No   Sexual activity: Yes    Partners: Male    Comment: husband had vasectomy  Other Topics Concern   Not on file  Social History Narrative   Not on file   Social Determinants of Health   Financial Resource Strain: Not on file  Food Insecurity: Not on file  Transportation Needs: Not on file  Physical Activity: Not on file  Stress: Not on file  Social Connections: Not on file  Intimate Partner Violence: Not on file   Health Maintenance  Topic Date Due   COVID-19 Vaccine (4 - Booster for Moderna series) 01/18/2021   Pneumococcal Vaccine 61-81 Years old (1 - PCV) 12/22/2021 (Originally 09/06/1985)   MAMMOGRAM  11/04/2021   PAP SMEAR-Modifier  07/28/2022   TETANUS/TDAP  11/15/2023   INFLUENZA VACCINE  Completed   Hepatitis C Screening  Completed   HIV Screening  Completed   HPV VACCINES  Aged Out    The following portions of the patient's history were reviewed and updated as appropriate: allergies, current medications, past family history, past medical history, past social history, past surgical history, and problem list.  Review of Systems A comprehensive review of systems was negative.   Objective:    BP (!) 158/108    Pulse 87    Ht 5\' 2"  (1.575 m)    Wt 158 lb 6.4 oz (71.8 kg)    SpO2 96%    BMI 28.97 kg/m  General appearance: alert, cooperative, and appears stated  age Head: Normocephalic, without obvious abnormality, atraumatic Eyes: conjunctivae/corneas clear. PERRL, EOM's intact. Fundi benign. Ears: normal TM's and external ear canals both ears Nose: Nares normal. Septum midline. Mucosa normal. No drainage or sinus tenderness. Throat: lips, mucosa, and tongue normal; teeth and gums normal Neck: no adenopathy, no carotid bruit, no JVD, supple, symmetrical, trachea midline, and thyroid not enlarged, symmetric, no tenderness/mass/nodules Back: symmetric, no curvature. ROM normal. No CVA tenderness. Lungs: clear to auscultation bilaterally Heart: systolic murmur: systolic ejection 3/6, crescendo at 2nd right intercostal space Abdomen: soft, non-tender; bowel sounds normal; no masses,  no organomegaly Extremities: extremities normal, atraumatic, no cyanosis or edema Pulses: 2+ and symmetric Skin: Skin color, texture, turgor normal. No rashes or lesions Lymph nodes: Cervical, supraclavicular, and axillary nodes normal. Neurologic: Alert and oriented X 3, normal strength and tone. Normal symmetric reflexes. Normal coordination and gait   .Marland Kitchen Depression screen [facility] 2/9 02/25/2021 11/15/2020 02/25/2020 11/28/2019 09/26/2019  Decreased Interest 0 1 0 2 2  Down, Depressed, Hopeless 0 1 0 2 2  PHQ - 2 Score 0 2 0 4 4  Altered sleeping 1 1 2 1 3   Tired, decreased energy 1 1 0 2 2  Change in appetite 0 0 0 0 0  Feeling bad or failure about  yourself  0 0 0 0 0  Trouble concentrating 0 0 0 0 0  Moving slowly or fidgety/restless 0 0 0 0 2  Suicidal thoughts 0 0 0 0 0  PHQ-9 Score 2 4 2 7 11   Difficult doing work/chores Not difficult at all Somewhat difficult Not difficult at all - Somewhat difficult  Some recent data might be hidden   .Marland Kitchen GAD 7 : Generalized Anxiety Score 02/25/2021 11/15/2020 02/25/2020 11/28/2019  Nervous, Anxious, on Edge 0 1 0 1  Control/stop worrying 1 0 0 1  Worry too much - different things 1 0 0 2  Trouble relaxing 1 0 0 1  Restless 0 0 0 0   Easily annoyed or irritable 0 1 0 1  Afraid - awful might happen 0 0 0 0  Total GAD 7 Score 3 2 0 6  Anxiety Difficulty Not difficult at all Not difficult at all Not difficult at all -     Assessment:    Healthy female exam.     Plan:    Marland KitchenMarland KitchenCandace was seen today for annual exam.  Diagnoses and all orders for this visit:  Routine physical examination -     TSH -     Lipid Panel w/reflex Direct LDL -     COMPLETE METABOLIC PANEL WITH GFR -     CBC with Differential/Platelet  Screening for diabetes mellitus -     COMPLETE METABOLIC PANEL WITH GFR  Screening for lipid disorders -     Lipid Panel w/reflex Direct LDL  Thyroid disorder screen -     TSH  Panic attacks -     ALPRAZolam (XANAX) 0.5 MG tablet; Take 1/2 (0.25mg ) to 1 (0.5mg ) tab up to three times per day as needed for severe anxiety and panic.  Primary hypertension -     hydrochlorothiazide (HYDRODIURIL) 12.5 MG tablet; Take 1 tablet (12.5 mg total) by mouth daily. -     ECHOCARDIOGRAM COMPLETE; Future -     EXERCISE TOLERANCE TEST (ETT); Future -     Cardiac Stress Test: Informed Consent Details: Physician/Practitioner Attestation; Transcribe to consent form and obtain patient signature  Heart murmur on physical examination -     ECHOCARDIOGRAM COMPLETE; Future -     EXERCISE TOLERANCE TEST (ETT); Future -     Cardiac Stress Test: Informed Consent Details: Physician/Practitioner Attestation; Transcribe to consent form and obtain patient signature  Atypical chest pain -     ECHOCARDIOGRAM COMPLETE; Future -     EXERCISE TOLERANCE TEST (ETT); Future -     Cardiac Stress Test: Informed Consent Details: Physician/Practitioner Attestation; Transcribe to consent form and obtain patient signature  Family history of early CAD    Discussed 150 minutes of exercise a week.  Encouraged vitamin D 1000 units and Calcium 1300mg  or 4 servings of dairy a day.  PHQ/GAD to goal. Continue celexa and xanax as needed.   Mammogram and pap UTD. Covid/flu vaccines UTD.  BP not to goal. Start HCTZ daily. Discussed side effects.  New murmur heard today could be because BP so high Ordered stress test and echo.  Strong family hx of cardiac disease Follow up BP in 2 weeks.    See After Visit Summary for Counseling Recommendations

## 2021-02-26 LAB — CBC WITH DIFFERENTIAL/PLATELET
Absolute Monocytes: 515 cells/uL (ref 200–950)
Basophils Absolute: 68 cells/uL (ref 0–200)
Basophils Relative: 1.1 %
Eosinophils Absolute: 99 cells/uL (ref 15–500)
Eosinophils Relative: 1.6 %
HCT: 43.2 % (ref 35.0–45.0)
Hemoglobin: 14.6 g/dL (ref 11.7–15.5)
Lymphs Abs: 1525 cells/uL (ref 850–3900)
MCH: 31.2 pg (ref 27.0–33.0)
MCHC: 33.8 g/dL (ref 32.0–36.0)
MCV: 92.3 fL (ref 80.0–100.0)
MPV: 10.3 fL (ref 7.5–12.5)
Monocytes Relative: 8.3 %
Neutro Abs: 3993 cells/uL (ref 1500–7800)
Neutrophils Relative %: 64.4 %
Platelets: 252 10*3/uL (ref 140–400)
RBC: 4.68 10*6/uL (ref 3.80–5.10)
RDW: 12.4 % (ref 11.0–15.0)
Total Lymphocyte: 24.6 %
WBC: 6.2 10*3/uL (ref 3.8–10.8)

## 2021-02-26 LAB — COMPLETE METABOLIC PANEL WITH GFR
AG Ratio: 1.6 (calc) (ref 1.0–2.5)
ALT: 13 U/L (ref 6–29)
AST: 15 U/L (ref 10–30)
Albumin: 4.6 g/dL (ref 3.6–5.1)
Alkaline phosphatase (APISO): 51 U/L (ref 31–125)
BUN: 15 mg/dL (ref 7–25)
CO2: 28 mmol/L (ref 20–32)
Calcium: 10.5 mg/dL — ABNORMAL HIGH (ref 8.6–10.2)
Chloride: 102 mmol/L (ref 98–110)
Creat: 0.96 mg/dL (ref 0.50–0.99)
Globulin: 2.9 g/dL (calc) (ref 1.9–3.7)
Glucose, Bld: 83 mg/dL (ref 65–99)
Potassium: 4.3 mmol/L (ref 3.5–5.3)
Sodium: 140 mmol/L (ref 135–146)
Total Bilirubin: 0.6 mg/dL (ref 0.2–1.2)
Total Protein: 7.5 g/dL (ref 6.1–8.1)
eGFR: 76 mL/min/{1.73_m2} (ref >=60)

## 2021-02-26 LAB — LIPID PANEL W/REFLEX DIRECT LDL
Cholesterol: 296 mg/dL — ABNORMAL HIGH (ref <200)
HDL: 77 mg/dL (ref >=50)
LDL Cholesterol (Calc): 192 mg/dL (calc) — ABNORMAL HIGH
Non-HDL Cholesterol (Calc): 219 mg/dL (calc) — ABNORMAL HIGH (ref <130)
Total CHOL/HDL Ratio: 3.8 (calc) (ref <5.0)
Triglycerides: 131 mg/dL (ref <150)

## 2021-02-26 LAB — TSH: TSH: 1.51 mIU/L

## 2021-02-28 ENCOUNTER — Encounter: Payer: Self-pay | Admitting: Physician Assistant

## 2021-02-28 ENCOUNTER — Other Ambulatory Visit: Payer: Self-pay | Admitting: Physician Assistant

## 2021-02-28 ENCOUNTER — Other Ambulatory Visit: Payer: Self-pay | Admitting: Neurology

## 2021-02-28 DIAGNOSIS — E7800 Pure hypercholesterolemia, unspecified: Secondary | ICD-10-CM | POA: Insufficient documentation

## 2021-02-28 MED ORDER — ATORVASTATIN CALCIUM 40 MG PO TABS: 40.0000 mg | ORAL_TABLET | Freq: Every day | ORAL | 3 refills | Status: DC

## 2021-02-28 NOTE — Progress Notes (Signed)
Sent lipitor. Recheck lipid in 6 months.

## 2021-02-28 NOTE — Progress Notes (Signed)
Monica Fry,   Thyroid looks great.  Hemoglobin looks good.  Kidney and liver look great.  No concerns with potassium.  Calcium is up a little. Recheck PTH with calcium in 2 weeks.  Elevated LDL, bad cholesterol, is very elevated at 192. We need to start a statin to lower your overall cholesterol and risk.  Good cholesterol does look good.

## 2021-03-01 DIAGNOSIS — R0789 Other chest pain: Secondary | ICD-10-CM | POA: Insufficient documentation

## 2021-03-01 DIAGNOSIS — Z8249 Family history of ischemic heart disease and other diseases of the circulatory system: Secondary | ICD-10-CM | POA: Insufficient documentation

## 2021-03-21 ENCOUNTER — Other Ambulatory Visit: Payer: Self-pay

## 2021-03-21 DIAGNOSIS — R011 Cardiac murmur, unspecified: Secondary | ICD-10-CM

## 2021-03-21 DIAGNOSIS — R0789 Other chest pain: Secondary | ICD-10-CM

## 2021-03-21 DIAGNOSIS — I10 Essential (primary) hypertension: Secondary | ICD-10-CM

## 2021-03-21 NOTE — Progress Notes (Signed)
Per Jenny Reichmann - Please change location on Exercise Tolerance Test to [facility].

## 2021-03-24 ENCOUNTER — Telehealth (HOSPITAL_COMMUNITY): Payer: Self-pay | Admitting: *Deleted

## 2021-03-24 NOTE — Telephone Encounter (Signed)
Close encounter 

## 2021-03-25 ENCOUNTER — Other Ambulatory Visit: Payer: Self-pay

## 2021-03-25 ENCOUNTER — Ambulatory Visit (HOSPITAL_COMMUNITY)
Admission: RE | Admit: 2021-03-25 | Discharge: 2021-03-25 | Disposition: A | Discharge status: Home / Self Care | Payer: Commercial Managed Care - PPO | Source: Ambulatory Visit | Attending: Cardiology | Admitting: Cardiology

## 2021-03-25 DIAGNOSIS — R0789 Other chest pain: Secondary | ICD-10-CM | POA: Diagnosis not present

## 2021-03-25 DIAGNOSIS — R011 Cardiac murmur, unspecified: Secondary | ICD-10-CM

## 2021-03-25 DIAGNOSIS — I10 Essential (primary) hypertension: Secondary | ICD-10-CM | POA: Diagnosis not present

## 2021-03-25 LAB — EXERCISE TOLERANCE TEST
Angina Index: 0
Estimated workload: 13.4
Exercise duration (min): 11 min
Exercise duration (sec): 30 s
MPHR: 179 {beats}/min
Peak HR: 176 {beats}/min
Percent HR: 98 %
Rest HR: 91 {beats}/min

## 2021-03-28 NOTE — Progress Notes (Signed)
Read by cardiologist.   Low risk stress test. Noted some mild ST depression in inferolateral leads and became up sloping with exercise. Consider non-specific.

## 2021-04-01 ENCOUNTER — Other Ambulatory Visit: Payer: Self-pay

## 2021-04-01 ENCOUNTER — Ambulatory Visit (HOSPITAL_BASED_OUTPATIENT_CLINIC_OR_DEPARTMENT_OTHER)
Admission: RE | Admit: 2021-04-01 | Discharge: 2021-04-01 | Disposition: A | Discharge status: Home / Self Care | Payer: Commercial Managed Care - PPO | Source: Ambulatory Visit | Attending: Physician Assistant | Admitting: Physician Assistant

## 2021-04-01 DIAGNOSIS — I10 Essential (primary) hypertension: Secondary | ICD-10-CM | POA: Insufficient documentation

## 2021-04-01 DIAGNOSIS — R0789 Other chest pain: Secondary | ICD-10-CM | POA: Diagnosis present

## 2021-04-01 DIAGNOSIS — R011 Cardiac murmur, unspecified: Secondary | ICD-10-CM | POA: Insufficient documentation

## 2021-04-01 LAB — ECHOCARDIOGRAM COMPLETE
AR max vel: 2.32 cm2
AV Area VTI: 2.03 cm2
AV Area mean vel: 2.2 cm2
AV Mean grad: 5 mmHg
AV Peak grad: 8.1 mmHg
Ao pk vel: 1.42 m/s
Area-P 1/2: 3.61 cm2
S' Lateral: 2.8 cm

## 2021-04-01 NOTE — Progress Notes (Signed)
°  Echocardiogram 2D Echocardiogram has been performed.  Monica Fry F 04/01/2021, 9:18 AM

## 2021-04-04 NOTE — Progress Notes (Signed)
EF is 60-65 percent which is great.  GREAT echo.

## 2021-04-10 ENCOUNTER — Other Ambulatory Visit: Payer: Self-pay | Admitting: Physician Assistant

## 2021-04-10 ENCOUNTER — Encounter: Payer: Self-pay | Admitting: Physician Assistant

## 2021-04-10 DIAGNOSIS — I10 Essential (primary) hypertension: Secondary | ICD-10-CM

## 2021-04-11 MED ORDER — HYDROCHLOROTHIAZIDE 12.5 MG PO TABS: 12.5000 mg | ORAL_TABLET | Freq: Every day | ORAL | 0 refills | Status: DC

## 2021-04-14 ENCOUNTER — Other Ambulatory Visit: Payer: Self-pay | Admitting: Physician Assistant

## 2021-04-14 DIAGNOSIS — F410 Panic disorder [episodic paroxysmal anxiety] without agoraphobia: Secondary | ICD-10-CM

## 2021-04-14 NOTE — Telephone Encounter (Signed)
Last written 02/25/2021 #40 with 1 refill Last appt 02/25/2021

## 2021-04-19 MED ORDER — HYDROCHLOROTHIAZIDE 25 MG PO TABS: 25.0000 mg | ORAL_TABLET | Freq: Every day | ORAL | 3 refills | Status: DC

## 2021-05-02 ENCOUNTER — Ambulatory Visit: Payer: Commercial Managed Care - PPO | Admitting: Family Medicine

## 2021-05-03 ENCOUNTER — Ambulatory Visit (INDEPENDENT_AMBULATORY_CARE_PROVIDER_SITE_OTHER): Payer: Commercial Managed Care - PPO | Admitting: Physician Assistant

## 2021-05-03 ENCOUNTER — Other Ambulatory Visit: Payer: Self-pay

## 2021-05-03 ENCOUNTER — Encounter: Payer: Self-pay | Admitting: Physician Assistant

## 2021-05-03 VITALS — BP 160/104 | HR 80 | Temp 97.9°F | Ht 62.0 in | Wt 161.0 lb

## 2021-05-03 DIAGNOSIS — J329 Chronic sinusitis, unspecified: Secondary | ICD-10-CM | POA: Diagnosis not present

## 2021-05-03 DIAGNOSIS — J40 Bronchitis, not specified as acute or chronic: Secondary | ICD-10-CM

## 2021-05-03 DIAGNOSIS — R030 Elevated blood-pressure reading, without diagnosis of hypertension: Secondary | ICD-10-CM | POA: Diagnosis not present

## 2021-05-03 MED ORDER — AZITHROMYCIN 250 MG PO TABS: ORAL_TABLET | ORAL | 0 refills | Status: DC

## 2021-05-03 MED ORDER — HYDROCOD POLI-CHLORPHE POLI ER 10-8 MG/5ML PO SUER: 5.0000 mL | Freq: Two times a day (BID) | ORAL | 0 refills | Status: DC | PRN

## 2021-05-03 NOTE — Progress Notes (Signed)
? ?Subjective:  ? ? Patient ID: Monica Fry, female    DOB: 11-Jul-1980, 41 y.o.   MRN: [id number] ? ?HPI ?Pt is a 41 yo female who presents to the clinic with 5 days of cough, congestion, sinus pressure, headache. She traveled to [place] last week and symptoms started then. She denies any fever, chills, body aches. She does have some chest tightness but mostly sinus pressure and sinus drainage. She has been using flonase, dayquil, nyquil with very little benefit. Negative for covid.  ?.. ?Active Ambulatory Problems  ?  Diagnosis Date Noted  ? Dysmenorrhea 10/30/2012  ? Allergic rhinitis 11/08/2012  ? Seasonal allergies 11/08/2012  ? H/O motion sickness 11/14/2013  ? History of cervical dysplasia 01/12/2015  ? Heart murmur on physical examination 02/07/2016  ? 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 White coat syndrome with hypertension 04/10/2016  ? Migraine with aura and with status migrainosus, not intractable 02/08/2018  ? Estrogen increased 02/04/2019  ? Elevated blood pressure reading 10/13/2019  ? Stress 10/13/2019  ? Anxiety 10/13/2019  ? Urticaria 10/13/2019  ? Tachycardia 10/20/2019  ? Localized swelling of both hands 10/20/2019  ? Depressed mood 12/01/2019  ? DUB (dysfunctional uterine bleeding) 12/01/2019  ? 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  ? Panic attacks 11/16/2020  ? Primary hypertension 02/25/2021  ? Serum calcium elevated 02/28/2021  ? Elevated LDL cholesterol level 02/28/2021  ? Family history of early CAD 03/01/2021  ? Atypical chest pain 03/01/2021  ? ?Resolved Ambulatory Problems  ?  Diagnosis Date Noted  ? Closed fracture of fifth metatarsal bone of left foot 04/28/2013  ? Oral contraceptive use 01/08/2014  ? Bursitis of left shoulder 07/15/2015  ? ?Past Medical History:  ?Diagnosis Date  ? Abnormal Pap smear of cervix 2010  ? Anemia   ? ? ? ?Review of Systems ? ?  See HPI.  ?Objective:  ? Physical Exam ?Vitals reviewed.  ?Constitutional:   ?   Appearance: Normal appearance.  ?HENT:  ?    Head: Normocephalic.  ?   Comments: Tenderness over maxillary sinuses to palpation.  ?   Right Ear: Tympanic membrane, ear canal and external ear normal. There is no impacted cerumen.  ?   Left Ear: Tympanic membrane, ear canal and external ear normal. There is no impacted cerumen.  ?   Nose: Congestion present.  ?   Mouth/Throat:  ?   Mouth: Mucous membranes are moist.  ?   Pharynx: Posterior oropharyngeal erythema present. No oropharyngeal exudate.  ?Eyes:  ?   Extraocular Movements: Extraocular movements intact.  ?   Conjunctiva/sclera: Conjunctivae normal.  ?   Pupils: Pupils are equal, round, and reactive to light.  ?Cardiovascular:  ?   Rate and Rhythm: Normal rate and regular rhythm.  ?   Pulses: Normal pulses.  ?Pulmonary:  ?   Effort: Pulmonary effort is normal.  ?   Breath sounds: No wheezing or rhonchi.  ?Musculoskeletal:  ?   Cervical back: Normal range of motion.  ?Lymphadenopathy:  ?   Cervical: Cervical adenopathy present.  ?Neurological:  ?   General: No focal deficit present.  ?   Mental Status: She is alert and oriented to person, place, and time.  ?Psychiatric:     ?   Mood and Affect: Mood normal.  ? ? ? ? ? ?   ?Assessment & Plan:  ?..Rekha was seen today for sinus problem and cough. ? ?Diagnoses and all orders for this visit: ? ?Sinobronchitis ?-  azithromycin (ZITHROMAX Z-PAK) 250 MG tablet; Take 2 tablets (500 mg) on  Day 1,  followed by 1 tablet (250 mg) once daily on Days 2 through 5. ?-     chlorpheniramine-HYDROcodone (TUSSIONEX PENNKINETIC ER) 10-8 MG/5ML; Take 5 mLs by mouth every 12 (twelve) hours as needed for cough. ? ?Elevated blood pressure reading ? ? ?Treated with zpak and tussionex.  ?Rest and hydrate.  ?Stop OTC meds due to BP elevation.  ?Continue to monitor BP. ?Follow up as needed or if symptoms persist.  ? ?

## 2021-05-03 NOTE — Patient Instructions (Signed)

## 2021-06-01 ENCOUNTER — Encounter: Payer: Self-pay | Admitting: Obstetrics and Gynecology

## 2021-06-03 IMAGING — US US PELVIS COMPLETE WITH TRANSVAGINAL
1 series · 15 of 25 positions shown · non-contrast
Comparison: None

CLINICAL DATA: Abnormal uterine bleeding, irregular bleeding, G2P2,
LMP 06/03/2020

EXAM:
TRANSABDOMINAL AND TRANSVAGINAL ULTRASOUND OF PELVIS
TECHNIQUE: Both transabdominal and transvaginal ultrasound examinations of the
pelvis were performed. Transabdominal technique was performed for
global imaging of the pelvis including uterus, ovaries, adnexal
regions, and pelvic cul-de-sac. It was necessary to proceed with
endovaginal exam following the transabdominal exam to visualize the
endometrium and RIGHT ovary.

[Series 1: us pelvis complete with transvaginal · 15 of 90 slices shown]
[im 1/90]
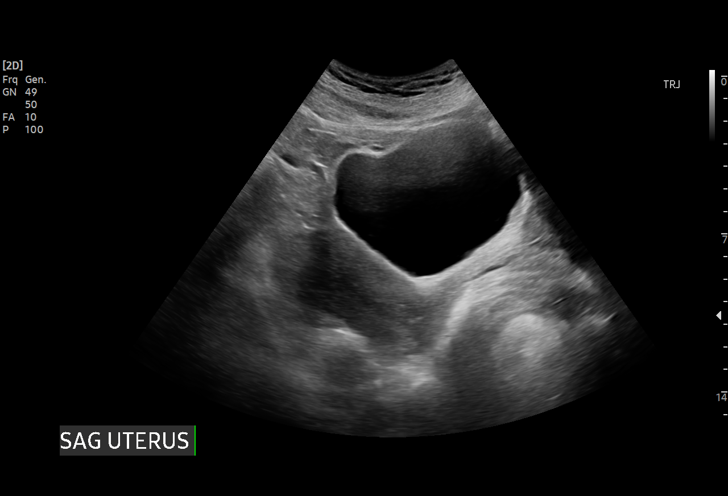
[im 8/90]
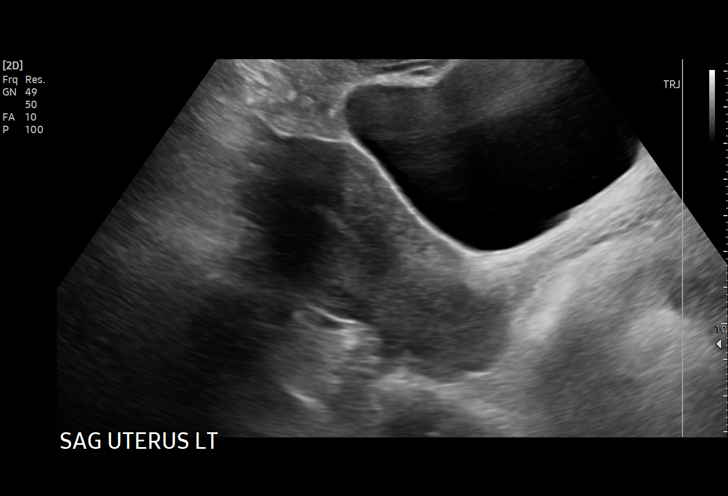
[im 15/90]
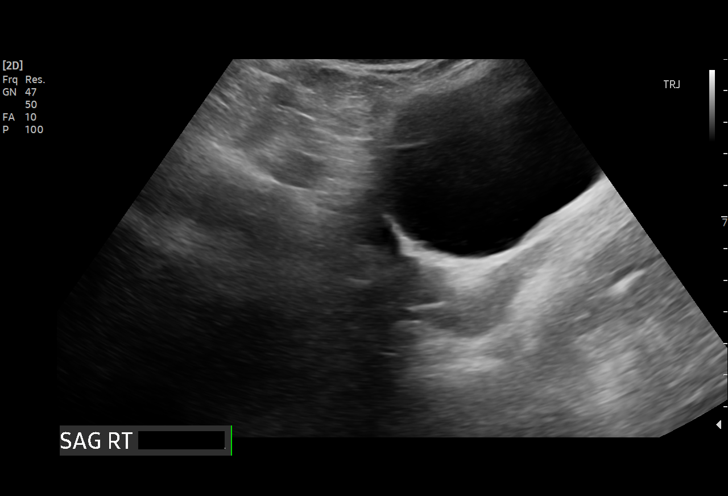
[im 19/90]
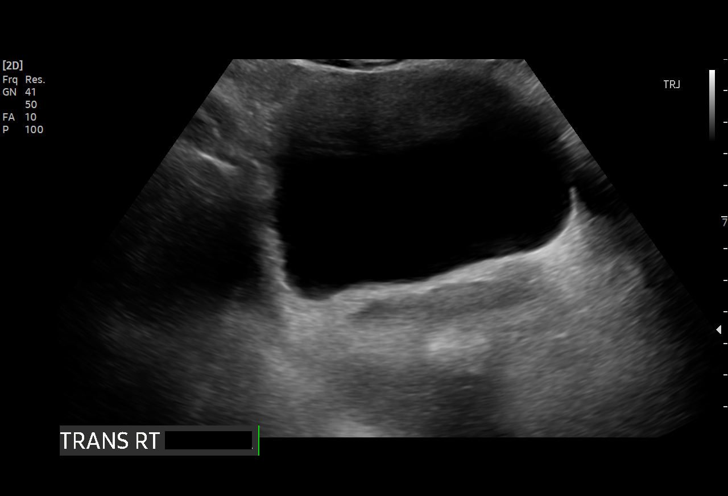
[im 26/90]
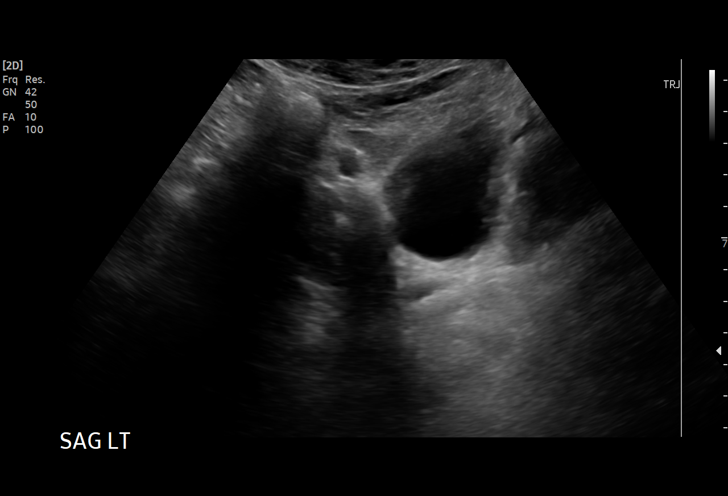
[im 34/90]
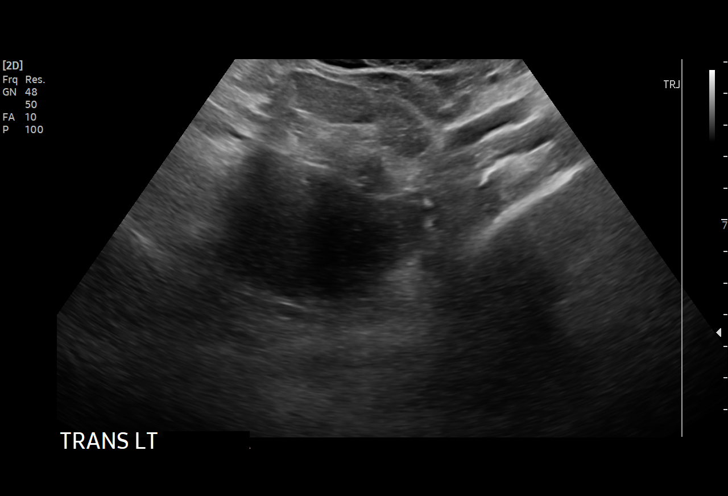
[im 38/90]
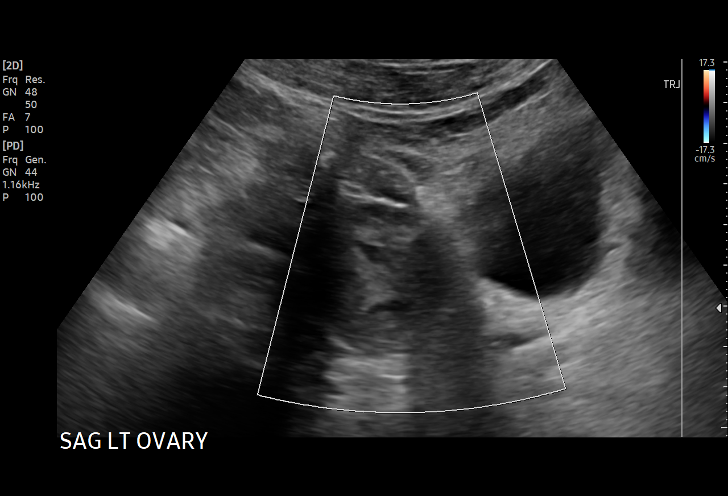
[im 45/90]
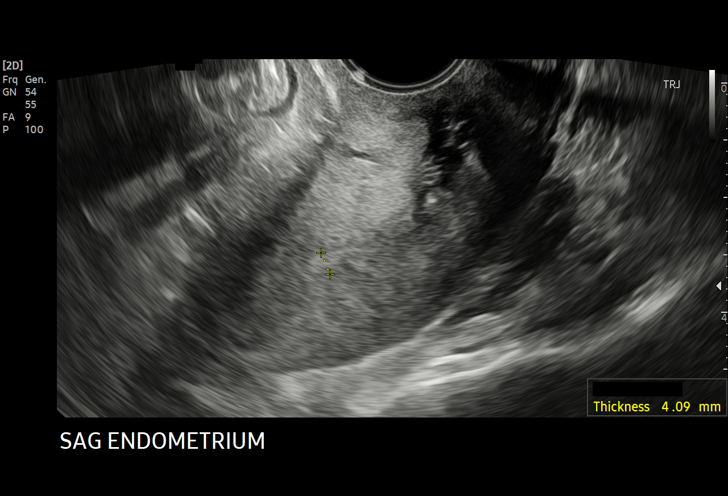
[im 52/90]
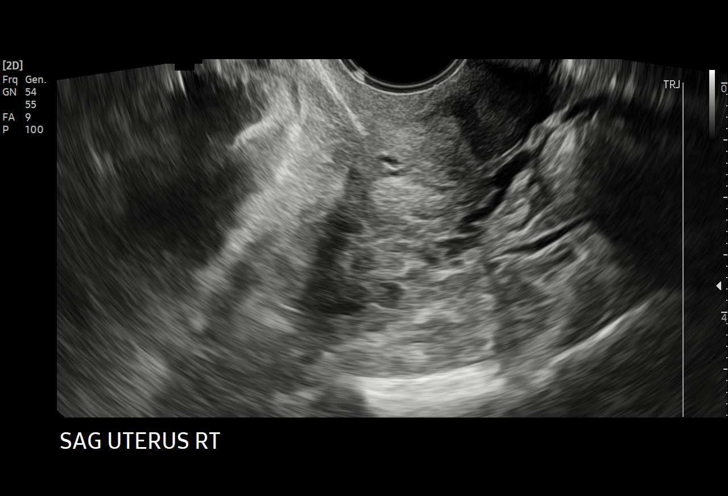
[im 56/90]
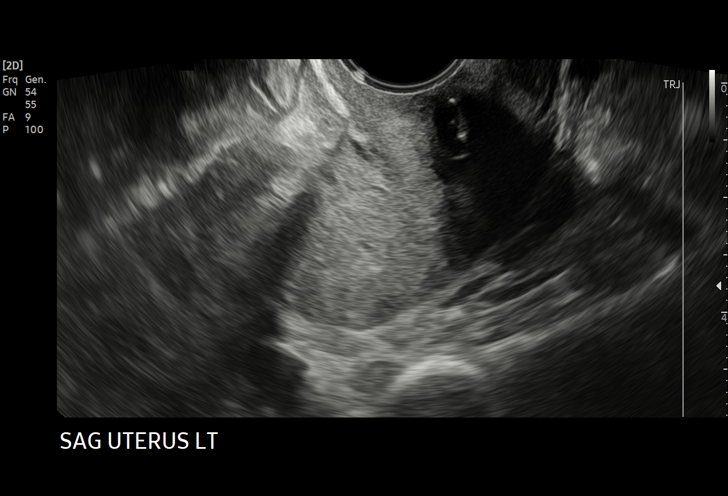
[im 64/90]
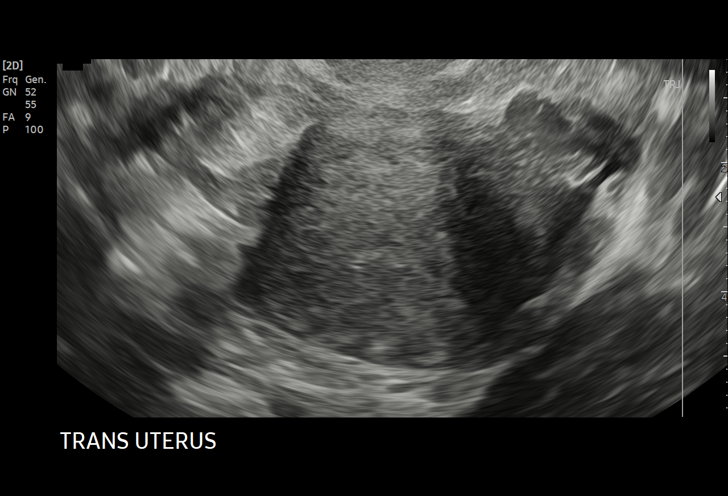
[im 71/90]
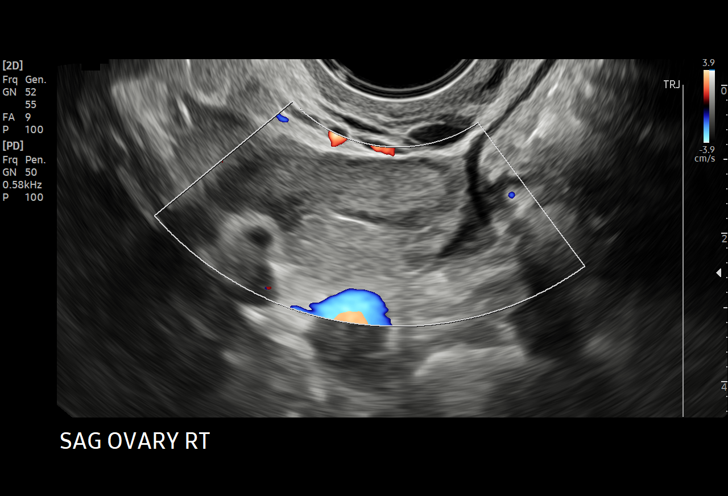
[im 75/90]
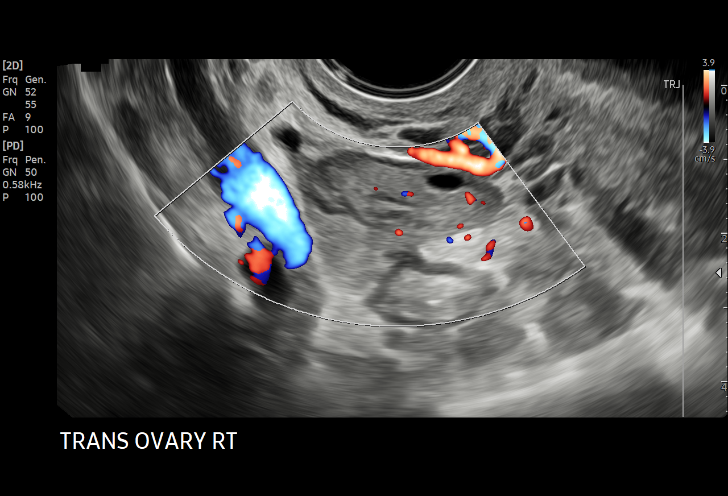
[im 82/90]
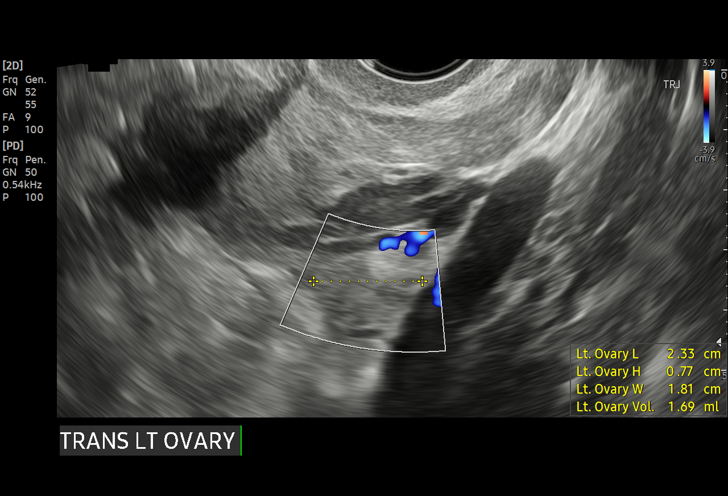
[im 90/90]
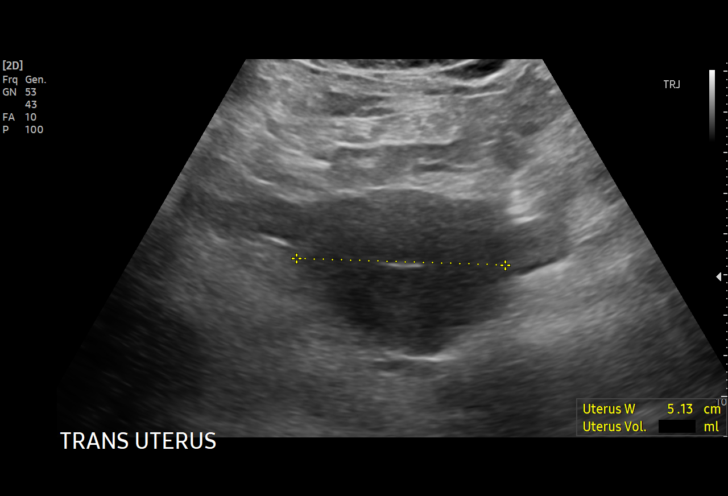

[15 of 25 positions shown; findings below may reference images not displayed]

FINDINGS: Uterus

Measurements: 7.8 x 3.5 x 5.1 cm = volume: 76 mL. Anteverted. Normal
morphology without mass

Endometrium

Thickness: 4 mm.  No endometrial fluid or mass.

Right ovary

Measurements: 2.7 x 1.0 x 2.2 cm = volume: 3.2 mL. Normal morphology
without mass

Left ovary

Measurements: 2.3 x 0.8 x 1.8 cm = volume: 1.7 mL. Normal morphology
without mass

Other findings

No free pelvic fluid.  No adnexal masses.
IMPRESSION: Normal exam.

Specifically, endometrial complex is normal in thickness and
appearance, 4 mm thick.

If bleeding remains unresponsive to hormonal or medical therapy,
sonohysterogram should be considered for focal lesion work-up. (Ref:
Radiological Reasoning: Algorithmic Workup of Abnormal Vaginal
Bleeding with Endovaginal Sonography and Sonohysterography. AJR
0000; 191:S68-73)

## 2021-06-22 NOTE — Progress Notes (Signed)
? ?GYNECOLOGY OFFICE VISIT NOTE ? ?History:  ? Monica Fry is a 41 y.o. G2P2002 here today for discussion of birth control options. She had had some episodes of AUB and was on combined OCPs which helped her periods. However, when reviewing she has a history of migraines with aura which we reviewed is an absolute contraindication to E containing birth control. At that time she preferred to try POPs but then had issues with her blood pressure on this and wanted to thus discuss her options.  She also notes it could have been related to work stress and other life factors at that time which have since changed.  She had a full cardiac work up which is negative. She hasn't been on it and her blood pressure has improved (she takes BP at home and normal although still elevated here) and she is being followed for this with her PCP.  ? ?She doesn't need the birth control for birth control - she does it for dysmenorrhea and heavy bleeding. Her husband had a vasectomy.  ? ? ?  ?Past Medical History:  ?Diagnosis Date  ? Abnormal Pap smear of cervix 2010  ? Anemia   ? ? ?Past Surgical History:  ?Procedure Laterality Date  ? CRYOTHERAPY    ? LAPAROSCOPY  2004  ? exploratory for endometriosis  ? ? ?The following portions of the patient's history were reviewed and updated as appropriate: allergies, current medications, past family history, past medical history, past social history, past surgical history and problem list.  ? ?Health Maintenance:   ?Normal pap and negative HRHPV:  ?Diagnosis  ?Date Value Ref Range Status  ?07/28/2019   Final  ? - Negative for intraepithelial lesion or malignancy (NILM)  ?  ? ?Normal mammogram on 10/2020.  ? ?Review of Systems:  ?Pertinent items noted in HPI and remainder of comprehensive ROS otherwise negative. ? ?Physical Exam:  ?BP (!) 152/109   Pulse 80   Resp 16   Ht 5\' 2"  (1.575 m)   Wt 160 lb (72.6 kg)   LMP 06/11/2021   BMI 29.26 kg/m?  ?CONSTITUTIONAL: Well-developed, well-nourished  female in no acute distress.  ?HEENT:  Normocephalic, atraumatic. External right and left ear normal. No scleral icterus.  ?NECK: Normal range of motion, supple, no masses noted on observation ?SKIN: No rash noted. Not diaphoretic. No erythema. No pallor. ?MUSCULOSKELETAL: Normal range of motion. No edema noted. ?NEUROLOGIC: Alert and oriented to person, place, and time. Normal muscle tone coordination. No cranial nerve deficit noted. ?PSYCHIATRIC: Normal mood and affect. Normal behavior. Normal judgment and thought content. ? ?CARDIOVASCULAR: Normal heart rate noted ?RESPIRATORY: Effort and breath sounds normal, no problems with respiration noted ?ABDOMEN: No masses noted. No other overt distention noted.   ? ?PELVIC: Normal appearing external genitalia; normal urethral meatus; normal appearing vaginal mucosa and cervix.  No abnormal discharge noted.  Normal uterine size, no other palpable masses, no uterine or adnexal tenderness. Performed in the presence of a chaperone ? ?IUD Insertion Procedure Note ?Procedure: IUD insertion with Mirena ?UPT: Negative ?GC/CT testing:  Declines ? ?Patient identified.  Risks, benefits and alternatives of procedure were discussed including irregular bleeding, cramping, infection, malpositioning or misplacement of the IUD outside the uterus which may require further procedure such as laparoscopy. Also discussed >99% contraception efficacy, increased risk of ectopic pregnancy with failure of method.   Emphasized that this did not protect against STIs, condoms recommended during all sexual encounters. Consent signed. Time out performed.  ? ?Speculum  inserted and cervix visualized, prepped with 3 swabs of betadine.  ? ?Uterus sounded to 7 cm and IUD then inserted without difficulty per manufacturer's instructions and strings cut to 3 cm below cervical os and all instruments removed. Pt tolerated well with minimal pain and bleeding.  ? ?Discussed concerning signs/symptoms and to call if  heavy bleeding, severe abdominal pain, or fever in the following 3 weeks. Manufacturer pamphlet/patient information given. Reviewed timing of efficacy for contraception and to use an alternative form of birth control until that time. ? ? ?Assessment and Plan:  ? 1. Birth control counseling ?- Reviewed different types of birth control available: OCPs, vaginal ring, Nexplanon, Depo, various types of IUDs, permanent sterilization.  We reviewed the advantages and risks of each (particularly risk of VTE with estrogen containing options). We discussed side effects of each. We also discussed [place] Fus and aygestin for other options.  ?- Patient has tried:  Combined OCPs, POP ?- Patient desires: IUD - placed today ? ? ?2. Migraine with aura and with status migrainosus, not intractable ? ?3. Dysmenorrhea ?Reviewed likely dysmenorrhea due to adenomyosis. She has had L/S to look for endometriosis and did not have it.  ? ?4. Encounter for IUD insertion ?-     POCT urine pregnancy ?-     levonorgestrel (MIRENA) 20 MCG/DAY IUD 1 each ? ? ? ?Routine preventative health maintenance measures emphasized. ?Please refer to After Visit Summary for other counseling recommendations.  ? ?Return if symptoms worsen or fail to improve. ? ?Milas Hock, MD, FACOG ?Obstetrician Heritage manager, Faculty Practice ?Center for [employer], [facility] ? ? ? ? ? ?

## 2021-06-23 ENCOUNTER — Ambulatory Visit (INDEPENDENT_AMBULATORY_CARE_PROVIDER_SITE_OTHER): Payer: Commercial Managed Care - PPO | Admitting: Obstetrics and Gynecology

## 2021-06-23 ENCOUNTER — Encounter: Payer: Self-pay | Admitting: Obstetrics and Gynecology

## 2021-06-23 VITALS — BP 152/109 | HR 80 | Resp 16 | Ht 62.0 in | Wt 160.0 lb

## 2021-06-23 DIAGNOSIS — Z30430 Encounter for insertion of intrauterine contraceptive device: Secondary | ICD-10-CM

## 2021-06-23 DIAGNOSIS — N946 Dysmenorrhea, unspecified: Secondary | ICD-10-CM | POA: Diagnosis not present

## 2021-06-23 DIAGNOSIS — G43101 Migraine with aura, not intractable, with status migrainosus: Secondary | ICD-10-CM

## 2021-06-23 DIAGNOSIS — Z3009 Encounter for other general counseling and advice on contraception: Secondary | ICD-10-CM

## 2021-06-23 LAB — POCT URINE PREGNANCY: Preg Test, Ur: NEGATIVE

## 2021-06-23 MED ORDER — LEVONORGESTREL 20 MCG/DAY IU IUD
1.0000 | INTRAUTERINE_SYSTEM | Freq: Once | INTRAUTERINE | Status: AC
  Administered 2021-06-23: 1 via INTRAUTERINE

## 2021-06-23 NOTE — Patient Instructions (Addendum)
Diagnosis we discussed today: Adenomyosis - chart has no hand out on this but reliable information can be found at: ? [email or web address]_type=default&display_rank=2 ?

## 2021-06-23 NOTE — Progress Notes (Signed)
Pt has had EKG and Stress test through [facility]. ?

## 2021-07-09 ENCOUNTER — Other Ambulatory Visit: Payer: Self-pay | Admitting: Physician Assistant

## 2021-07-09 DIAGNOSIS — G43001 Migraine without aura, not intractable, with status migrainosus: Secondary | ICD-10-CM

## 2021-07-18 ENCOUNTER — Encounter: Payer: Self-pay | Admitting: Obstetrics and Gynecology

## 2021-09-11 IMAGING — DX DG HIP (WITH OR WITHOUT PELVIS) 2-3V*L*
3 series · 3 of 3 positions shown · non-contrast
Comparison: None.

CLINICAL DATA: Pain localizing over the region of the left iliac
crest.

EXAM:
DG HIP (WITH OR WITHOUT PELVIS) 2-3V LEFT

[pelvis ap]
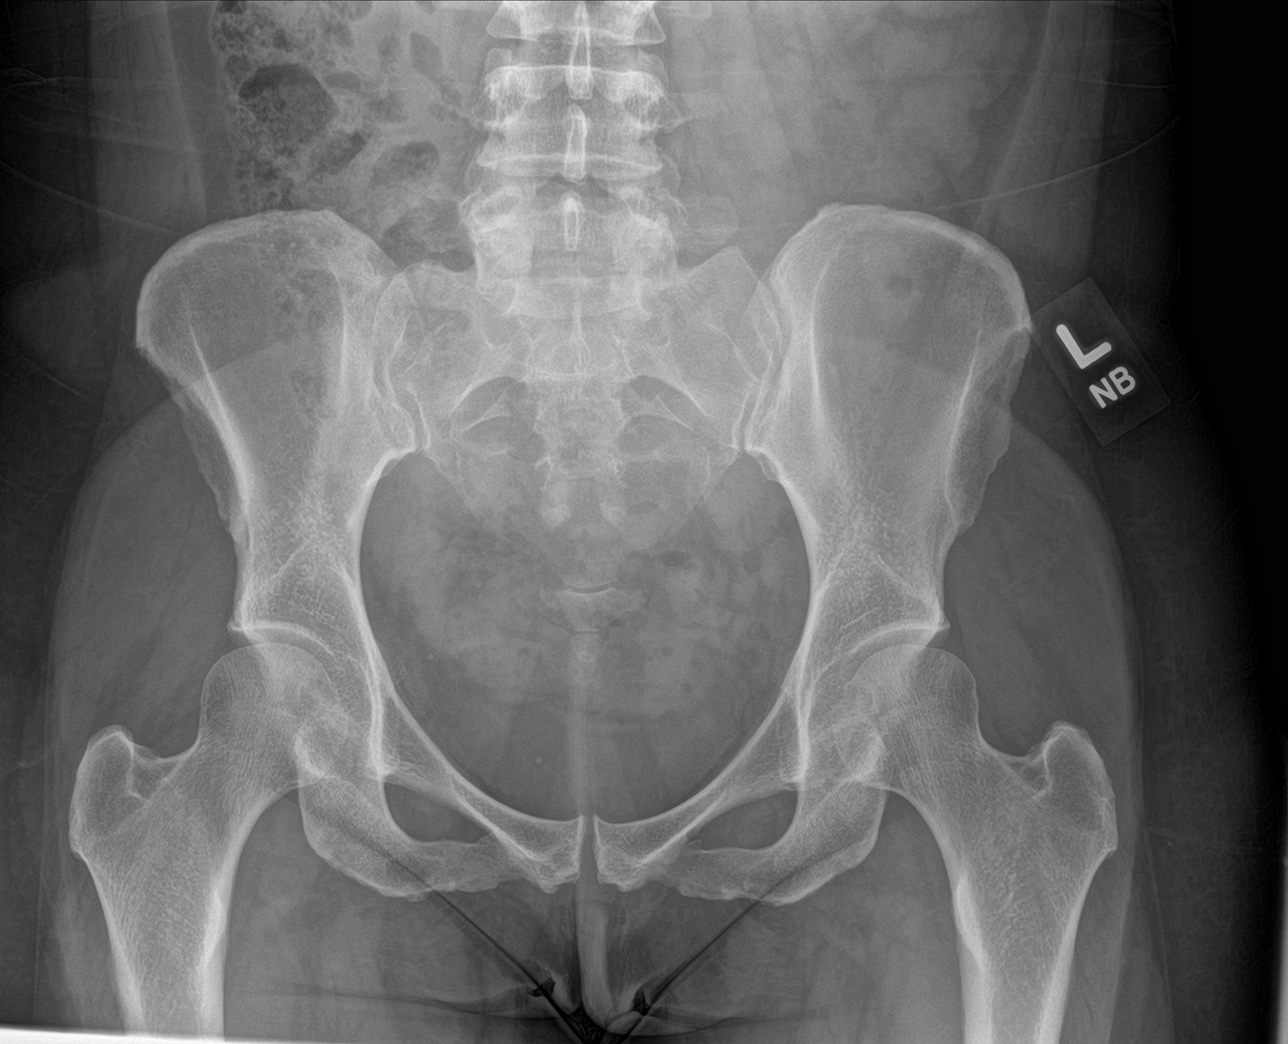

[hip ap]
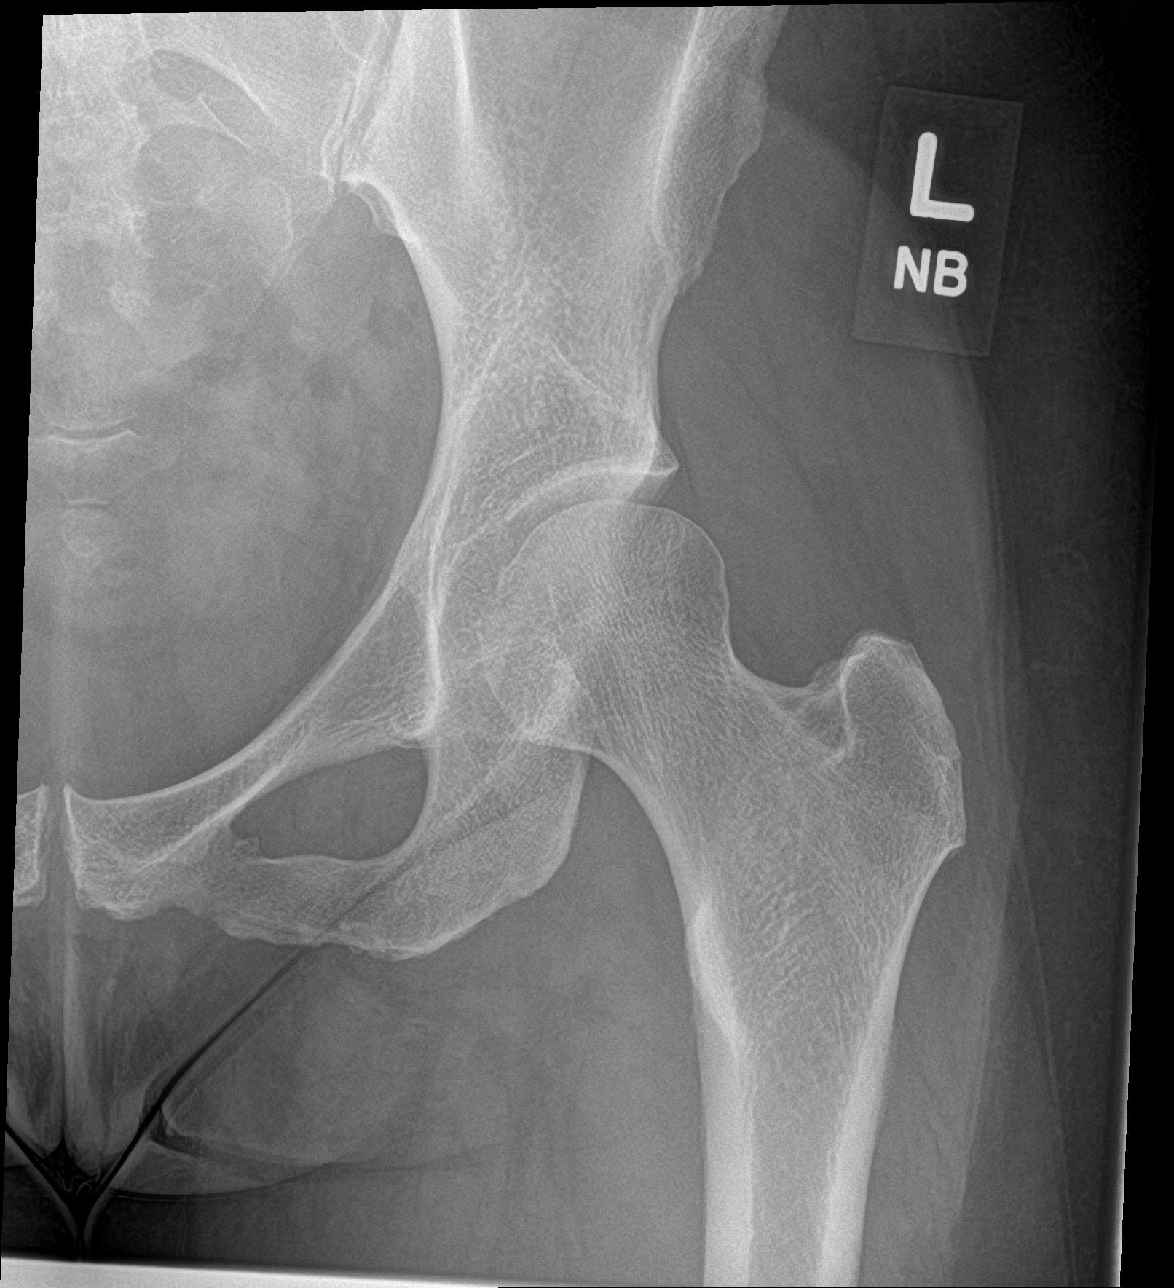

[hip lat]
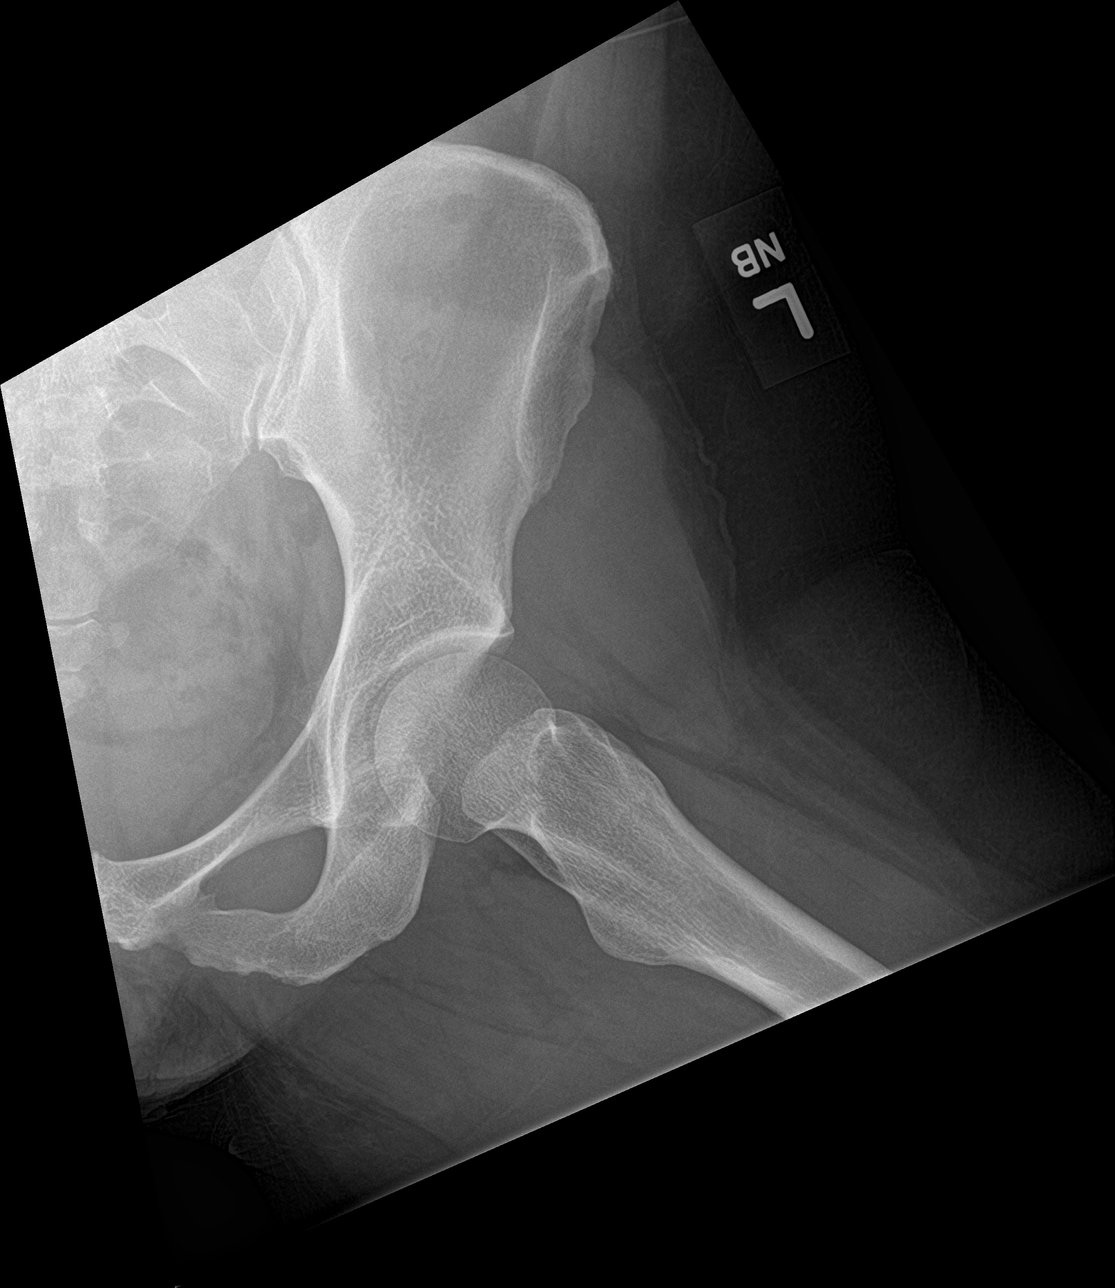

[3 of 3 positions shown; findings below may reference images not displayed]

FINDINGS: The left hip joint demonstrates normal alignment without evidence of
fracture or degenerative disease. No bony lesions or destruction.
The bony pelvis is intact without evidence of fracture, diastasis or
lesion. Soft tissues are unremarkable.
IMPRESSION: Negative.

## 2021-10-06 ENCOUNTER — Encounter: Payer: Self-pay | Admitting: Physician Assistant

## 2021-10-11 ENCOUNTER — Telehealth: Payer: Self-pay | Admitting: *Deleted

## 2021-10-11 NOTE — Telephone Encounter (Signed)
Left patient a message to call the office about annual appointment scheduled for 10/27/2021. Does her insurance allow annual prior to 1 year and 1 day.

## 2021-10-27 ENCOUNTER — Ambulatory Visit (INDEPENDENT_AMBULATORY_CARE_PROVIDER_SITE_OTHER): Payer: Commercial Managed Care - PPO | Admitting: Obstetrics and Gynecology

## 2021-10-27 ENCOUNTER — Encounter: Payer: Self-pay | Admitting: Obstetrics and Gynecology

## 2021-10-27 VITALS — BP 137/97 | HR 89 | Resp 16 | Ht 63.0 in | Wt 159.0 lb

## 2021-10-27 DIAGNOSIS — Z01419 Encounter for gynecological examination (general) (routine) without abnormal findings: Secondary | ICD-10-CM | POA: Diagnosis not present

## 2021-10-27 NOTE — Progress Notes (Signed)
   ANNUAL EXAM Patient name: Monica Fry MRN [id number]  Date of birth: Jan 21, 1981 Chief Complaint:   Annual Exam  History of Present Illness:   Monica Fry is a 41 y.o. G71P2002 female being seen today for a routine annual exam.   IUD placed 06/23/2021 for BTB to help with her painful periods. Has had Dx L/S in the past and did not have endometriosis. She had tried OCPs and POPs in the past. IUD working well for her well.   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 She is married. She has 2 daughters.   No LMP recorded. (Menstrual status: IUD).  Contraception: vasectomy Last Pap: 07/2019. Result was normal with negative HPV Last Mammogram: 11/03/20.   Health Maintenance Due  Topic Date Due   COVID-19 Vaccine (4 - Moderna risk series) 01/18/2021   INFLUENZA VACCINE  09/20/2021    Review of Systems:   Pertinent items are noted in HPI Denies any headaches, blurred vision, fatigue, shortness of breath, chest pain, abdominal pain, abnormal vaginal discharge/itching/odor/irritation, problems with periods, bowel movements, urination, or intercourse unless otherwise stated above.  Pertinent History Reviewed:  Reviewed past medical,surgical, social and family history.  Reviewed problem list, medications and allergies. Physical Assessment:   Vitals:   10/27/21 1445  BP: (!) 137/97  Pulse: 89  Resp: 16  Weight: 159 lb (72.1 kg)  Height: 5\' 3"  (1.6 m)  Body mass index is 28.17 kg/m.   Physical Examination:  General appearance - well appearing, and in no distress Mental status - alert, oriented to person, place, and time Psych:  She has a normal mood and affect Skin - warm and dry, normal color, no suspicious lesions noted Chest - effort normal Heart - normal rate  Breasts - breasts appear normal, no suspicious masses, no skin or nipple changes or axillary nodes Abdomen - soft, nontender, nondistended, no masses or organomegaly Pelvic -   VULVA: normal appearing vulva with no masses, tenderness or lesions  VAGINA: normal appearing vagina with normal color and discharge, no lesions  CERVIX: normal appearing cervix without discharge or lesions, no CMT, IUD strings visualized UTERUS: uterus is felt to be normal size, shape, consistency and nontender  ADNEXA: No adnexal masses or tenderness noted. Extremities:  No swelling or varicosities noted  Chaperone present for exam  No results found for this or any previous visit (from the past 24 hour(s)).  Assessment & Plan:  Evangaline was seen today for annual exam.  Diagnoses and all orders for this visit:  Encounter for annual routine gynecological examination -     MM 3D SCREEN BREAST BILATERAL; Future  - Cervical cancer screening: Discussed guidelines. Pap with HPV done 07/2019 - Gardasil:  Declines - STD Testing: Not indicated - Birth Control: Vasectomy - has IUD to help with dysmenorrhea.  - Breast Health: Encouraged self breast awareness/SBE. Teaching provided. Discussed limits of clinical breast exam for detecting breast cancer. Rx given for MXR - F/U 12 months and prn    Orders Placed This Encounter  Procedures   MM 3D SCREEN BREAST BILATERAL    Meds: No orders of the defined types were placed in this encounter.   Follow-up: Return in about 1 year (around 10/28/2022) for annual.  12/28/2022, MD 10/27/2021 3:09 PM

## 2021-11-10 ENCOUNTER — Other Ambulatory Visit: Payer: Self-pay | Admitting: Physician Assistant

## 2021-11-10 ENCOUNTER — Ambulatory Visit (INDEPENDENT_AMBULATORY_CARE_PROVIDER_SITE_OTHER): Payer: Commercial Managed Care - PPO

## 2021-11-10 DIAGNOSIS — Z01419 Encounter for gynecological examination (general) (routine) without abnormal findings: Secondary | ICD-10-CM

## 2021-11-10 DIAGNOSIS — Z1231 Encounter for screening mammogram for malignant neoplasm of breast: Secondary | ICD-10-CM | POA: Diagnosis not present

## 2021-11-10 DIAGNOSIS — F419 Anxiety disorder, unspecified: Secondary | ICD-10-CM

## 2021-11-10 DIAGNOSIS — R4589 Other symptoms and signs involving emotional state: Secondary | ICD-10-CM

## 2021-11-17 ENCOUNTER — Other Ambulatory Visit: Payer: Self-pay | Admitting: Physician Assistant

## 2021-11-17 DIAGNOSIS — R4589 Other symptoms and signs involving emotional state: Secondary | ICD-10-CM

## 2021-11-17 DIAGNOSIS — F419 Anxiety disorder, unspecified: Secondary | ICD-10-CM

## 2021-11-21 ENCOUNTER — Encounter: Payer: Self-pay | Admitting: Physician Assistant

## 2021-11-21 ENCOUNTER — Ambulatory Visit (INDEPENDENT_AMBULATORY_CARE_PROVIDER_SITE_OTHER): Payer: Commercial Managed Care - PPO | Admitting: Physician Assistant

## 2021-11-21 VITALS — BP 130/98 | HR 88 | Ht 63.0 in | Wt 161.0 lb

## 2021-11-21 DIAGNOSIS — R4589 Other symptoms and signs involving emotional state: Secondary | ICD-10-CM | POA: Diagnosis not present

## 2021-11-21 DIAGNOSIS — I10 Essential (primary) hypertension: Secondary | ICD-10-CM

## 2021-11-21 DIAGNOSIS — F410 Panic disorder [episodic paroxysmal anxiety] without agoraphobia: Secondary | ICD-10-CM | POA: Diagnosis not present

## 2021-11-21 DIAGNOSIS — F419 Anxiety disorder, unspecified: Secondary | ICD-10-CM | POA: Diagnosis not present

## 2021-11-21 MED ORDER — ALPRAZOLAM 0.5 MG PO TABS: ORAL_TABLET | ORAL | 1 refills | Status: DC

## 2021-11-21 MED ORDER — CITALOPRAM HYDROBROMIDE 10 MG PO TABS: 10.0000 mg | ORAL_TABLET | Freq: Every day | ORAL | 1 refills | Status: DC

## 2021-11-21 MED ORDER — AMLODIPINE BESYLATE 2.5 MG PO TABS: 2.5000 mg | ORAL_TABLET | Freq: Every day | ORAL | 0 refills | Status: DC

## 2021-11-21 NOTE — Progress Notes (Signed)
Established Patient Office Visit  Subjective   Patient ID: Monica Fry, female    DOB: 29-Nov-1980  Age: 41 y.o. MRN: [id number]  Chief Complaint  Patient presents with   Follow-up   Anxiety    HPI Pt is a 41 yo female with anxiety, depression, HTN who presents to the clinic for medication refills.   She is doing well with mood. Her anxiety and depression are stable. She is taking xanax 1/2 tablet daily and celexa daily. No side effects. No concerns.   She is taking HcTZ for BP. She is not checking at home. No CP, palpitations, headaches or vision changes.   Review of Systems  All other systems reviewed and are negative.     Objective:     BP (!) 130/98   Pulse 88   Ht 5\' 3"  (1.6 m)   Wt 161 lb (73 kg)   SpO2 100%   BMI 28.52 kg/m  BP Readings from Last 3 Encounters:  11/21/21 (!) 130/98  10/27/21 (!) 137/97  06/23/21 (!) 152/109   Wt Readings from Last 3 Encounters:  11/21/21 161 lb (73 kg)  10/27/21 159 lb (72.1 kg)  06/23/21 160 lb (72.6 kg)    .08/23/21    11/21/2021    2:31 PM 05/03/2021   10:58 AM 02/25/2021    8:51 AM 11/15/2020    2:26 PM 02/25/2020    7:54 AM  Depression screen PHQ 2/9  Decreased Interest 0 0 0 1 0  Down, Depressed, Hopeless 0 0 0 1 0  PHQ - 2 Score 0 0 0 2 0  Altered sleeping 0  1 1 2   Tired, decreased energy 1  1 1  0  Change in appetite 0  0 0 0  Feeling bad or failure about yourself  0  0 0 0  Trouble concentrating 0  0 0 0  Moving slowly or fidgety/restless 0  0 0 0  Suicidal thoughts 0  0 0 0  PHQ-9 Score 1  2 4 2   Difficult doing work/chores Not difficult at all  Not difficult at all Somewhat difficult Not difficult at all   .4/9    11/21/2021    2:31 PM 02/25/2021    8:51 AM 11/15/2020    2:27 PM 02/25/2020    7:54 AM  GAD 7 : Generalized Anxiety Score  Nervous, Anxious, on Edge 1 0 1 0  Control/stop worrying 0 1 0 0  Worry too much - different things 0 1 0 0  Trouble relaxing 1 1 0 0  Restless 0 0 0 0  Easily annoyed or  irritable 0 0 1 0  Afraid - awful might happen 0 0 0 0  Total GAD 7 Score 2 3 2  0  Anxiety Difficulty Not difficult at all Not difficult at all Not difficult at all Not difficult at all      Physical Exam Constitutional:      Appearance: Normal appearance.  HENT:     Head: Normocephalic.  Cardiovascular:     Rate and Rhythm: Normal rate and regular rhythm.     Pulses: Normal pulses.  Pulmonary:     Breath sounds: Normal breath sounds.  Musculoskeletal:     Right lower leg: No edema.     Left lower leg: No edema.  Neurological:     General: No focal deficit present.     Mental Status: She is alert and oriented to person, place, and time.  Psychiatric:  Mood and Affect: Mood normal.          Assessment & Plan:  Marland KitchenMarland KitchenCandace was seen today for follow-up and anxiety.  Diagnoses and all orders for this visit:  Anxiety -     citalopram (CELEXA) 10 MG tablet; Take 1 tablet (10 mg total) by mouth daily.  Depressed mood -     citalopram (CELEXA) 10 MG tablet; Take 1 tablet (10 mg total) by mouth daily.  Panic attacks -     ALPRAZolam (XANAX) 0.5 MG tablet; Take 1/2 tablet (0.25mg ) to 1 tablet (0.5mg ) up to three times per day as needed for severe anxiety and panic.  Primary hypertension -     amLODipine (NORVASC) 2.5 MG tablet; Take 1 tablet (2.5 mg total) by mouth daily.   PHQ/GAD improved and no concerns Continue celexa and xanax  BP not to goal on bottom Continue HCTZ and add norvasc Discussed SE follow up as needed  Follow up in 3 months at CPE    Return in about 3 months (around 02/21/2022).    Iran Planas, PA-C

## 2021-12-23 ENCOUNTER — Encounter: Payer: Self-pay | Admitting: Family Medicine

## 2021-12-23 ENCOUNTER — Ambulatory Visit (INDEPENDENT_AMBULATORY_CARE_PROVIDER_SITE_OTHER): Payer: Commercial Managed Care - PPO | Admitting: Family Medicine

## 2021-12-23 VITALS — BP 121/93 | HR 87 | Ht 63.0 in | Wt 164.0 lb

## 2021-12-23 DIAGNOSIS — J40 Bronchitis, not specified as acute or chronic: Secondary | ICD-10-CM | POA: Diagnosis not present

## 2021-12-23 MED ORDER — ALBUTEROL SULFATE HFA 108 (90 BASE) MCG/ACT IN AERS: 2.0000 | INHALATION_SPRAY | Freq: Four times a day (QID) | RESPIRATORY_TRACT | 0 refills | Status: DC | PRN

## 2021-12-23 MED ORDER — PROMETHAZINE-DM 6.25-15 MG/5ML PO SYRP: 5.0000 mL | ORAL_SOLUTION | Freq: Four times a day (QID) | ORAL | 0 refills | Status: AC | PRN

## 2021-12-23 NOTE — Progress Notes (Signed)
   Acute Office Visit  Subjective:     Patient ID: Monica Fry, female    DOB: 12/15/1980, 41 y.o.   MRN: [id number]  Chief Complaint  Patient presents with   Cough    Cough Pertinent negatives include no chest pain, chills, fever, headaches or shortness of breath.   Patient is in today for dry cough for two weeks that is keeping her up at night.   Review of Systems  Constitutional:  Negative for chills and fever.  Respiratory:  Positive for cough. Negative for shortness of breath.   Cardiovascular:  Negative for chest pain.  Neurological:  Negative for headaches.        Objective:    BP (!) 121/93   Pulse 87   Ht 5\' 3"  (1.6 m)   Wt 164 lb (74.4 kg)   SpO2 100%   BMI 29.05 kg/m    Physical Exam Vitals and nursing note reviewed.  Constitutional:      General: She is not in acute distress.    Appearance: Normal appearance.  HENT:     Head: Normocephalic and atraumatic.     Right Ear: External ear normal.     Left Ear: External ear normal.     Nose: Nose normal.  Eyes:     Conjunctiva/sclera: Conjunctivae normal.  Cardiovascular:     Rate and Rhythm: Normal rate and regular rhythm.  Pulmonary:     Effort: Pulmonary effort is normal. No respiratory distress.     Breath sounds: Normal breath sounds. No wheezing.  Neurological:     General: No focal deficit present.     Mental Status: She is alert and oriented to person, place, and time.  Psychiatric:        Mood and Affect: Mood normal.        Behavior: Behavior normal.        Thought Content: Thought content normal.        Judgment: Judgment normal.     No results found for any visits on 12/23/21.      Assessment & Plan:   Problem List Items Addressed This Visit       Respiratory   Bronchitis - Primary    - nighttime cough persistent for two weeks. Have gone ahead and given albuterol inhaler and promethazine DM to help control cough. Pt said tussinex does not work for her  - gave RTC  precautions      Relevant Medications   promethazine-dextromethorphan (PROMETHAZINE-DM) 6.25-15 MG/5ML syrup   albuterol (VENTOLIN HFA) 108 (90 Base) MCG/ACT inhaler    Meds ordered this encounter  Medications   promethazine-dextromethorphan (PROMETHAZINE-DM) 6.25-15 MG/5ML syrup    Sig: Take 5 mLs by mouth every 6 (six) hours as needed for up to 6 days for cough.    Dispense:  118 mL    Refill:  0   albuterol (VENTOLIN HFA) 108 (90 Base) MCG/ACT inhaler    Sig: Inhale 2 puffs into the lungs every 6 (six) hours as needed for wheezing or shortness of breath.    Dispense:  8 g    Refill:  0    No follow-ups on file.  Owens Loffler, DO

## 2021-12-23 NOTE — Assessment & Plan Note (Signed)
-   nighttime cough persistent for two weeks. Have gone ahead and given albuterol inhaler and promethazine DM to help control cough. Pt said tussinex does not work for her  - gave RTC precautions

## 2021-12-29 ENCOUNTER — Encounter: Payer: Self-pay | Admitting: Family Medicine

## 2021-12-30 ENCOUNTER — Encounter: Payer: Self-pay | Admitting: Physician Assistant

## 2021-12-30 MED ORDER — AZITHROMYCIN 250 MG PO TABS: ORAL_TABLET | ORAL | 0 refills | Status: DC

## 2021-12-30 MED ORDER — HYDROCOD POLI-CHLORPHE POLI ER 10-8 MG/5ML PO SUER: 5.0000 mL | Freq: Two times a day (BID) | ORAL | 0 refills | Status: DC | PRN

## 2022-02-13 ENCOUNTER — Other Ambulatory Visit: Payer: Self-pay | Admitting: Physician Assistant

## 2022-02-15 ENCOUNTER — Other Ambulatory Visit: Payer: Self-pay | Admitting: Physician Assistant

## 2022-02-15 DIAGNOSIS — I10 Essential (primary) hypertension: Secondary | ICD-10-CM

## 2022-02-15 MED ORDER — AMLODIPINE BESYLATE 2.5 MG PO TABS: 2.5000 mg | ORAL_TABLET | Freq: Every day | ORAL | 0 refills | Status: DC

## 2022-03-03 ENCOUNTER — Ambulatory Visit (INDEPENDENT_AMBULATORY_CARE_PROVIDER_SITE_OTHER): Payer: Commercial Managed Care - PPO | Admitting: Physician Assistant

## 2022-03-03 ENCOUNTER — Encounter: Payer: Self-pay | Admitting: Physician Assistant

## 2022-03-03 VITALS — BP 125/97 | HR 87 | Ht 63.0 in | Wt 160.0 lb

## 2022-03-03 DIAGNOSIS — Z0000 Encounter for general adult medical examination without abnormal findings: Secondary | ICD-10-CM | POA: Diagnosis not present

## 2022-03-03 DIAGNOSIS — Z1329 Encounter for screening for other suspected endocrine disorder: Secondary | ICD-10-CM | POA: Diagnosis not present

## 2022-03-03 DIAGNOSIS — R4589 Other symptoms and signs involving emotional state: Secondary | ICD-10-CM

## 2022-03-03 DIAGNOSIS — E7800 Pure hypercholesterolemia, unspecified: Secondary | ICD-10-CM

## 2022-03-03 DIAGNOSIS — E663 Overweight: Secondary | ICD-10-CM

## 2022-03-03 DIAGNOSIS — I10 Essential (primary) hypertension: Secondary | ICD-10-CM | POA: Diagnosis not present

## 2022-03-03 DIAGNOSIS — F5101 Primary insomnia: Secondary | ICD-10-CM

## 2022-03-03 DIAGNOSIS — F410 Panic disorder [episodic paroxysmal anxiety] without agoraphobia: Secondary | ICD-10-CM

## 2022-03-03 DIAGNOSIS — F419 Anxiety disorder, unspecified: Secondary | ICD-10-CM

## 2022-03-03 MED ORDER — CITALOPRAM HYDROBROMIDE 10 MG PO TABS: 10.0000 mg | ORAL_TABLET | Freq: Every day | ORAL | 3 refills | Status: DC

## 2022-03-03 MED ORDER — WEGOVY 0.5 MG/0.5ML ~~LOC~~ SOAJ: 0.5000 mg | SUBCUTANEOUS | 0 refills | Status: DC

## 2022-03-03 MED ORDER — AMLODIPINE BESYLATE 5 MG PO TABS: 5.0000 mg | ORAL_TABLET | Freq: Every day | ORAL | 1 refills | Status: DC

## 2022-03-03 MED ORDER — HYDROCHLOROTHIAZIDE 25 MG PO TABS: 25.0000 mg | ORAL_TABLET | Freq: Every day | ORAL | 3 refills | Status: DC

## 2022-03-03 MED ORDER — ATORVASTATIN CALCIUM 40 MG PO TABS: 40.0000 mg | ORAL_TABLET | Freq: Every day | ORAL | 3 refills | Status: DC

## 2022-03-03 NOTE — Progress Notes (Signed)
Complete physical exam  Patient: Monica Fry   DOB: 04/08/1980   42 y.o. Female  MRN: [id number]  Subjective:    Chief Complaint  Patient presents with   Annual Exam    Monica Fry is a 42 y.o. female who presents today for a complete physical exam. She reports consuming a general diet. The patient does not participate in regular exercise at present. She generally feels well. She reports sleeping poorly. She does have additional problems to discuss today.   Trouble going to sleep. Xanax helps but she wanted to get off that. Reading seems to help before bedtime.   She struggling with weight. She is trying to eat well and not losing anything. She would like to try wegovy.   Denies any CP, palpitations, headaches, or vision changes. Taking norvasc daily.    Most recent fall risk assessment:    03/03/2022    9:04 AM  Fall Risk   Falls in the past year? 0  Number falls in past yr: 0  Injury with Fall? 0  Risk for fall due to : No Fall Risks  Follow up Falls evaluation completed     Most recent depression screenings:    03/03/2022    9:20 AM 12/23/2021    8:23 AM  PHQ 2/9 Scores  PHQ - 2 Score 0 0  PHQ- 9 Score 3     Vision:Within last year and Dental: No current dental problems and Receives regular dental care  Patient Active Problem List   Diagnosis Date Noted   Primary insomnia 03/03/2022   Overweight (BMI 25.0-29.9) 03/03/2022   Bronchitis 12/23/2021   Family history of early CAD 03/01/2021   Atypical chest pain 03/01/2021   Serum calcium elevated 02/28/2021   Elevated LDL cholesterol level 02/28/2021   Primary hypertension 02/25/2021   Panic attacks 11/16/2020   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   Depressed mood 12/01/2019   Tachycardia 10/20/2019   Localized swelling of both hands 10/20/2019   Elevated blood pressure reading 10/13/2019   Stress 10/13/2019   Anxiety 10/13/2019   Urticaria 10/13/2019   Estrogen increased 02/04/2019    Migraine with aura and with status migrainosus, not intractable 02/08/2018   White coat syndrome with hypertension 04/10/2016   Heart murmur on physical examination 02/07/2016   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   History of cervical dysplasia 01/12/2015   H/O motion sickness 11/14/2013   Allergic rhinitis 11/08/2012   Seasonal allergies 11/08/2012   Dysmenorrhea 10/30/2012   Past Medical History:  Diagnosis Date   Abnormal Pap smear of cervix 2010   Anemia    Family History  Problem Relation Age of Onset   Diabetes Father    Heart disease Father    Hypertension Father    Hyperlipidemia Father    Cancer Paternal Grandmother        breast and lung   Thrombosis Maternal Grandmother    Depression Maternal Grandmother    Hyperlipidemia Maternal Grandmother    Hypertension Maternal Grandmother    Breast cancer Maternal Grandmother    Heart attack Paternal Grandfather    Hyperlipidemia Paternal Grandfather    Hypertension Paternal Grandfather    No Known Allergies    Patient Care Team: [facility], Leatrice Jewels as PCP - General (Family Medicine)   Outpatient Medications Prior to Visit  Medication Sig   albuterol (VENTOLIN HFA) 108 (90 Base) MCG/ACT inhaler Inhale 2 puffs into the lungs every 6 (six) hours as needed for wheezing or shortness  of breath.   ALPRAZolam (XANAX) 0.5 MG tablet Take 1/2 tablet (0.25mg ) to 1 tablet (0.5mg ) up to three times per day as needed for severe anxiety and panic.   cetirizine (ZYRTEC) 10 MG tablet Take 10 mg by mouth daily.   cholecalciferol (VITAMIN D3) 25 MCG (1000 UT) tablet Take 1,000 Units by mouth daily.   cyclobenzaprine (FLEXERIL) 10 MG tablet Take 1 tablet (10 mg total) by mouth as needed for muscle spasms.   fluticasone (FLONASE) 50 MCG/ACT nasal spray Place 2 sprays into the nose as needed. Reported on 07/15/2015   levonorgestrel (MIRENA) 20 MCG/DAY IUD 1 each by Intrauterine route once.   Multiple Vitamin (MULTIVITAMIN ADULT PO) Take by mouth.    mupirocin ointment (BACTROBAN) 2 % Apply 1 application topically 3 (three) times daily. Apply to affected area for 7-10 days.   naratriptan (AMERGE) 2.5 MG tablet TAKE ONE TABLET BY MOUTH AT ONSET OF HEADACHE. IF RETURNS OR DOES NOT RESOLVE, MAY REPEAT AFTER FOUR HOURS DO NOT EXCEED 2/24 HOURS   ondansetron (ZOFRAN) 8 MG tablet Take 1 tablet (8 mg total) by mouth every 8 (eight) hours as needed for nausea or vomiting.   tretinoin (RETIN-A) 0.05 % cream Apply topically at bedtime.   [DISCONTINUED] amLODipine (NORVASC) 2.5 MG tablet Take 1 tablet (2.5 mg total) by mouth daily.   [DISCONTINUED] atorvastatin (LIPITOR) 40 MG tablet Take 1 tablet (40 mg total) by mouth daily.   [DISCONTINUED] citalopram (CELEXA) 10 MG tablet Take 1 tablet (10 mg total) by mouth daily.   [DISCONTINUED] hydrochlorothiazide (HYDRODIURIL) 25 MG tablet Take 1 tablet (25 mg total) by mouth daily.   [DISCONTINUED] azithromycin (ZITHROMAX) 250 MG tablet 2 tabs po on Day 1, then 1 tab daily Days 2 - 5   [DISCONTINUED] chlorpheniramine-HYDROcodone (TUSSIONEX) 10-8 MG/5ML Take 5 mLs by mouth every 12 (twelve) hours as needed for cough (cough, will cause drowsiness.).   No facility-administered medications prior to visit.    ROS  Trouble sleeping      Objective:     BP (!) 125/97   Pulse 87   Ht 5\' 3"  (1.6 m)   Wt 160 lb (72.6 kg)   SpO2 100%   BMI 28.34 kg/m  BP Readings from Last 3 Encounters:  03/03/22 (!) 125/97  12/23/21 (!) 121/93  11/21/21 (!) 130/98   Wt Readings from Last 3 Encounters:  03/03/22 160 lb (72.6 kg)  12/23/21 164 lb (74.4 kg)  11/21/21 161 lb (73 kg)      Physical Exam   BP (!) 125/97   Pulse 87   Ht 5\' 3"  (1.6 m)   Wt 160 lb (72.6 kg)   SpO2 100%   BMI 28.34 kg/m   General Appearance:    Alert, cooperative, no distress, appears stated age  Head:    Normocephalic, without obvious abnormality, atraumatic  Eyes:    PERRL, conjunctiva/corneas clear, EOM's intact, fundi     benign, both eyes  Ears:    Normal TM's and external ear canals, both ears  Nose:   Nares normal, septum midline, mucosa normal, no drainage    or sinus tenderness  Throat:   Lips, mucosa, and tongue normal; teeth and gums normal  Neck:   Supple, symmetrical, trachea midline, no adenopathy;    thyroid:  no enlargement/tenderness/nodules; no carotid   bruit or JVD  Back:     Symmetric, no curvature, ROM normal, no CVA tenderness  Lungs:     Clear to auscultation bilaterally, respirations  unlabored  Chest Wall:    No tenderness or deformity   Heart:    Regular rate and rhythm, S1 and S2 normal, 2/6 murmur, rub   or gallop     Abdomen:     Soft, non-tender, bowel sounds active all four quadrants,    no masses, no organomegaly        Extremities:   Extremities normal, atraumatic, no cyanosis or edema  Pulses:   2+ and symmetric all extremities  Skin:   Skin color, texture, turgor normal, no rashes or lesions  Lymph nodes:   Cervical, supraclavicular, and axillary nodes normal  Neurologic:   CNII-XII intact, normal strength, sensation and reflexes    throughout      Assessment & Plan:    Routine Health Maintenance and Physical Exam  Immunization History  Administered Date(s) Administered   Influenza Split 12/06/2010, 12/13/2011   Influenza,inj,Quad PF,6+ Mos 10/30/2012, 11/14/2013, 11/13/2018, 10/13/2019   Influenza-Unspecified 11/23/2014, 11/29/2016, 11/11/2020   Moderna SARS-COV2 Booster Vaccination 11/11/2020   Moderna Sars-Covid-2 Vaccination 02/17/2019, 03/17/2019, 12/20/2019   PFIZER(Purple Top)SARS-COV-2 Vaccination 11/23/2020   Tdap 11/14/2013    Health Maintenance  Topic Date Due   COVID-19 Vaccine (4 - 2023-24 season) 03/19/2022 (Originally 10/21/2021)   INFLUENZA VACCINE  05/21/2022 (Originally 09/20/2021)   PAP SMEAR-Modifier  07/28/2022   MAMMOGRAM  11/11/2022   DTaP/Tdap/Td (2 - Td or Tdap) 11/15/2023   Hepatitis C Screening  Completed   HIV Screening  Completed    HPV VACCINES  Aged Out    Discussed health benefits of physical activity, and encouraged her to engage in regular exercise appropriate for her age and condition.  Marland KitchenHal Hope was seen today for annual exam.  Diagnoses and all orders for this visit:  Primary hypertension -     COMPLETE METABOLIC PANEL WITH GFR -     amLODipine (NORVASC) 5 MG tablet; Take 1 tablet (5 mg total) by mouth daily. -     hydrochlorothiazide (HYDRODIURIL) 25 MG tablet; Take 1 tablet (25 mg total) by mouth daily. -     WEGOVY 0.5 MG/0.5ML SOAJ; Inject 0.5 mg into the skin once a week. Use this dose for 1 month (4 shots) and then increase to next higher dose.  Elevated LDL cholesterol level -     Lipid Panel w/reflex Direct LDL  Routine physical examination -     TSH -     Lipid Panel w/reflex Direct LDL -     COMPLETE METABOLIC PANEL WITH GFR -     CBC with Differential/Platelet  Thyroid disorder screen -     TSH  Panic attacks  Anxiety -     citalopram (CELEXA) 10 MG tablet; Take 1 tablet (10 mg total) by mouth daily.  Depressed mood -     citalopram (CELEXA) 10 MG tablet; Take 1 tablet (10 mg total) by mouth daily.  Overweight (BMI 25.0-29.9) -     WEGOVY 0.5 MG/0.5ML SOAJ; Inject 0.5 mg into the skin once a week. Use this dose for 1 month (4 shots) and then increase to next higher dose.  Primary insomnia  Other orders -     atorvastatin (LIPITOR) 40 MG tablet; Take 1 tablet (40 mg total) by mouth daily.   .. Discussed 150 minutes of exercise a week.  Encouraged vitamin D 1000 units and Calcium 1300mg  or 4 servings of dairy a day.  Fasting labs ordered BP not to goal Increased norvasc to 5mg  daily, keep checking BP at home to get  bottom number under 90.  PHQ no concerns Not using xanax regularly.  Refilled celexa Pap/mammogram UTD Declined covid and flu vaccine  .Marland KitchenDiscussed low carb diet with 1500 calories and 80g of protein.  Exercising at least 150 minutes a week.  My Fitness Pal  could be a Microbiologist.  Discussed options Sent wegovy for overweight and HTN Discussed side effects Follow up in 6 months.   Discussed good sleep routine Consider calm aid at bedtime  Return in about 6 months (around 09/01/2022).     Iran Planas, PA-C

## 2022-03-03 NOTE — Patient Instructions (Signed)

## 2022-03-04 LAB — COMPLETE METABOLIC PANEL WITH GFR
AG Ratio: 2 (calc) (ref 1.0–2.5)
ALT: 16 U/L (ref 6–29)
AST: 18 U/L (ref 10–30)
Albumin: 5.1 g/dL (ref 3.6–5.1)
Alkaline phosphatase (APISO): 66 U/L (ref 31–125)
BUN: 15 mg/dL (ref 7–25)
CO2: 28 mmol/L (ref 20–32)
Calcium: 10.1 mg/dL (ref 8.6–10.2)
Chloride: 100 mmol/L (ref 98–110)
Creat: 0.87 mg/dL (ref 0.50–0.99)
Globulin: 2.5 g/dL (calc) (ref 1.9–3.7)
Glucose, Bld: 83 mg/dL (ref 65–99)
Potassium: 3.3 mmol/L — ABNORMAL LOW (ref 3.5–5.3)
Sodium: 142 mmol/L (ref 135–146)
Total Bilirubin: 0.6 mg/dL (ref 0.2–1.2)
Total Protein: 7.6 g/dL (ref 6.1–8.1)
eGFR: 85 mL/min/{1.73_m2} (ref >=60)

## 2022-03-04 LAB — CBC WITH DIFFERENTIAL/PLATELET
Absolute Monocytes: 540 cells/uL (ref 200–950)
Basophils Absolute: 57 cells/uL (ref 0–200)
Basophils Relative: 0.8 %
Eosinophils Absolute: 99 cells/uL (ref 15–500)
Eosinophils Relative: 1.4 %
HCT: 43.3 % (ref 35.0–45.0)
Hemoglobin: 14.9 g/dL (ref 11.7–15.5)
Lymphs Abs: 1541 cells/uL (ref 850–3900)
MCH: 31.4 pg (ref 27.0–33.0)
MCHC: 34.4 g/dL (ref 32.0–36.0)
MCV: 91.4 fL (ref 80.0–100.0)
MPV: 10.5 fL (ref 7.5–12.5)
Monocytes Relative: 7.6 %
Neutro Abs: 4864 cells/uL (ref 1500–7800)
Neutrophils Relative %: 68.5 %
Platelets: 236 10*3/uL (ref 140–400)
RBC: 4.74 10*6/uL (ref 3.80–5.10)
RDW: 12.2 % (ref 11.0–15.0)
Total Lymphocyte: 21.7 %
WBC: 7.1 10*3/uL (ref 3.8–10.8)

## 2022-03-04 LAB — LIPID PANEL W/REFLEX DIRECT LDL
Cholesterol: 157 mg/dL (ref <200)
HDL: 69 mg/dL (ref >=50)
LDL Cholesterol (Calc): 72 mg/dL (calc)
Non-HDL Cholesterol (Calc): 88 mg/dL (calc) (ref <130)
Total CHOL/HDL Ratio: 2.3 (calc) (ref <5.0)
Triglycerides: 84 mg/dL (ref <150)

## 2022-03-04 LAB — TSH: TSH: 1.06 mIU/L

## 2022-03-06 ENCOUNTER — Encounter: Payer: Self-pay | Admitting: Physician Assistant

## 2022-03-06 DIAGNOSIS — E876 Hypokalemia: Secondary | ICD-10-CM

## 2022-03-06 NOTE — Progress Notes (Signed)
Monica Fry,   Cholesterol looks so much better!  Hemoglobin and white blood count look great.  Thyroid looks GREAT! Kidney, liver, and glucose look wonderful.  Potassium is down a little.  Reasonable to start a low dose prescription potassium or OTC and recheck in 2 weeks. Thoughts? Other option is to cut the diuretic back to 12.5mg  daily and see what increase of norvasc does for BP. Recheck BP in 2weeks.

## 2022-03-09 ENCOUNTER — Other Ambulatory Visit: Payer: Self-pay | Admitting: Physician Assistant

## 2022-03-09 DIAGNOSIS — F410 Panic disorder [episodic paroxysmal anxiety] without agoraphobia: Secondary | ICD-10-CM

## 2022-03-09 NOTE — Telephone Encounter (Signed)
Last written 11/21/2021 #40 with 1 refill  Last appt 03/03/2022

## 2022-03-25 ENCOUNTER — Encounter: Payer: Self-pay | Admitting: Physician Assistant

## 2022-03-28 ENCOUNTER — Telehealth: Payer: Self-pay

## 2022-03-28 NOTE — Telephone Encounter (Addendum)
Initiated Prior authorization [id number] 0.25MG/0.5ML auto-injectors Via: Covermymeds Case/Key:[id number] Status: denied  as of 03/28/22 Reason:plan exclusion Notified Pt via: Mychart

## 2022-03-29 ENCOUNTER — Encounter: Payer: Self-pay | Admitting: Physician Assistant

## 2022-03-29 MED ORDER — AMBULATORY NON FORMULARY MEDICATION: 0 refills | Status: DC

## 2022-04-26 MED ORDER — AMBULATORY NON FORMULARY MEDICATION: 1 refills | Status: DC

## 2022-04-26 NOTE — Addendum Note (Signed)
Addended by: Donella Stade on: 04/26/2022 03:45 PM   Modules accepted: Orders

## 2022-05-08 ENCOUNTER — Other Ambulatory Visit: Payer: Self-pay | Admitting: Physician Assistant

## 2022-06-18 ENCOUNTER — Encounter: Payer: Self-pay | Admitting: Physician Assistant

## 2022-06-18 DIAGNOSIS — E7800 Pure hypercholesterolemia, unspecified: Secondary | ICD-10-CM

## 2022-06-18 DIAGNOSIS — I10 Essential (primary) hypertension: Secondary | ICD-10-CM

## 2022-06-21 MED ORDER — AMBULATORY NON FORMULARY MEDICATION: 5 refills | Status: DC

## 2022-07-21 NOTE — Addendum Note (Signed)
Addended by: Nani Gasser D on: 07/21/2022 12:48 PM   Modules accepted: Orders

## 2022-07-21 NOTE — Addendum Note (Signed)
Addended by: Chalmers Cater on: 07/21/2022 02:05 PM   Modules accepted: Orders

## 2022-07-21 NOTE — Telephone Encounter (Signed)
Orders Placed This Encounter  Procedures   Lipid Panel w/reflex Direct LDL   COMPLETE METABOLIC PANEL WITH GFR   Meds ordered this encounter  Medications   AMBULATORY NON FORMULARY MEDICATION    Sig: Semaglutide 2.5mg  with pyridoxine 10mg  per mL Inject 1.2mg (48units) once weekly    Dispense:  200 Units    Refill:  5    Order Specific Question:   Supervising Provider    Answer:   Nani Gasser D [[id number]]

## 2022-07-21 NOTE — Telephone Encounter (Signed)
Hi Ang, can you look at this one. Looks like has 5 refills. I am looking at that right?

## 2022-07-29 LAB — COMPLETE METABOLIC PANEL WITH GFR
AG Ratio: 2 (calc) (ref 1.0–2.5)
ALT: 10 U/L (ref 6–29)
AST: 14 U/L (ref 10–30)
Albumin: 4.5 g/dL (ref 3.6–5.1)
Alkaline phosphatase (APISO): 61 U/L (ref 31–125)
BUN: 18 mg/dL (ref 7–25)
CO2: 29 mmol/L (ref 20–32)
Calcium: 9.5 mg/dL (ref 8.6–10.2)
Chloride: 101 mmol/L (ref 98–110)
Creat: 0.91 mg/dL (ref 0.50–0.99)
Globulin: 2.3 g/dL (calc) (ref 1.9–3.7)
Glucose, Bld: 91 mg/dL (ref 65–99)
Potassium: 3.5 mmol/L (ref 3.5–5.3)
Sodium: 138 mmol/L (ref 135–146)
Total Bilirubin: 0.5 mg/dL (ref 0.2–1.2)
Total Protein: 6.8 g/dL (ref 6.1–8.1)
eGFR: 81 mL/min/{1.73_m2} (ref >=60)

## 2022-07-29 LAB — LIPID PANEL W/REFLEX DIRECT LDL
Cholesterol: 120 mg/dL (ref <200)
HDL: 51 mg/dL (ref >=50)
LDL Cholesterol (Calc): 53 mg/dL (calc)
Non-HDL Cholesterol (Calc): 69 mg/dL (calc) (ref <130)
Total CHOL/HDL Ratio: 2.4 (calc) (ref <5.0)
Triglycerides: 77 mg/dL (ref <150)

## 2022-07-31 NOTE — Progress Notes (Signed)
Monica Fry,   Labs look great.  Kidney, liver, glucose look wonderful.  Potassium normal.  Cholesterol looks wonderful!

## 2022-08-24 ENCOUNTER — Other Ambulatory Visit: Payer: Self-pay | Admitting: Physician Assistant

## 2022-08-24 DIAGNOSIS — I10 Essential (primary) hypertension: Secondary | ICD-10-CM

## 2022-09-11 ENCOUNTER — Ambulatory Visit (INDEPENDENT_AMBULATORY_CARE_PROVIDER_SITE_OTHER): Payer: Commercial Managed Care - PPO | Admitting: Physician Assistant

## 2022-09-11 ENCOUNTER — Encounter: Payer: Self-pay | Admitting: Physician Assistant

## 2022-09-11 VITALS — BP 114/84 | HR 96 | Resp 16 | Wt 141.1 lb

## 2022-09-11 DIAGNOSIS — E663 Overweight: Secondary | ICD-10-CM

## 2022-09-11 DIAGNOSIS — I10 Essential (primary) hypertension: Secondary | ICD-10-CM | POA: Diagnosis not present

## 2022-09-11 DIAGNOSIS — E7800 Pure hypercholesterolemia, unspecified: Secondary | ICD-10-CM | POA: Diagnosis not present

## 2022-09-11 MED ORDER — AMBULATORY NON FORMULARY MEDICATION: 5 refills | Status: DC

## 2022-09-11 NOTE — Progress Notes (Signed)
Established Patient Office Visit  Subjective   Patient ID: Monica Fry, female    DOB: 1980/11/08  Age: 42 y.o. MRN: [id number]  Chief Complaint  Patient presents with   6 mnth follow-up    HPI Pt is a 42 yo female who presents to the clinic for 6 month follow up on weight and HTN.   Her blood pressure is doing amazing! She even has had some lows in the 90s over 70s. No dizziness. She is on norvasc and hydrochlorothiazide. She is also taking compounded sematglutide. She is doing great with no side effects. She has lost 28lbs total and 11lbs since last visit. She is eating healthy and exercising regularly. She would like to continue medication.   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   White coat syndrome with hypertension 04/10/2016   Migraine with aura and with status migrainosus, not intractable 02/08/2018   Estrogen increased 02/04/2019   Elevated blood pressure reading 10/13/2019   Stress 10/13/2019   Anxiety 10/13/2019   Urticaria 10/13/2019   Tachycardia 10/20/2019   Localized swelling of both hands 10/20/2019   Depressed mood 12/01/2019   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   Panic attacks 11/16/2020   Primary hypertension 02/25/2021   Serum calcium elevated 02/28/2021   Elevated LDL cholesterol level 02/28/2021   Family history of early CAD 03/01/2021   Atypical chest pain 03/01/2021   Bronchitis 12/23/2021   Primary insomnia 03/03/2022   Overweight (BMI 25.0-29.9) 03/03/2022   Resolved Ambulatory Problems    Diagnosis Date Noted   Closed fracture of fifth metatarsal bone of left foot 04/28/2013   Oral contraceptive use 01/08/2014   Bursitis of left shoulder 07/15/2015   DUB (dysfunctional  uterine bleeding) 12/01/2019   Past Medical History:  Diagnosis Date   Abnormal Pap smear of cervix 2010   Anemia      Review of Systems  All other systems reviewed and are negative.     Objective:     BP 114/84 (BP Location: Left Arm, Patient Position: Sitting, Cuff Size: Normal)   Pulse 96   Resp 16   Wt 141 lb 1.9 oz (64 kg)   SpO2 99%   BMI 25.00 kg/m  BP Readings from Last 3 Encounters:  09/11/22 114/84  03/03/22 (!) 125/97  12/23/21 (!) 121/93   Wt Readings from Last 3 Encounters:  09/11/22 141 lb 1.9 oz (64 kg)  03/03/22 160 lb (72.6 kg)  12/23/21 164 lb (74.4 kg)      Physical Exam Constitutional:      Appearance: Normal appearance.  HENT:     Head: Normocephalic.  Cardiovascular:     Rate and Rhythm: Normal rate and regular rhythm.  Pulmonary:     Effort: Pulmonary effort is normal.     Breath sounds: Normal breath sounds.  Musculoskeletal:     Cervical back: No tenderness.  Lymphadenopathy:     Cervical: No cervical adenopathy.  Neurological:     General: No focal deficit present.     Mental Status: She is alert and oriented to person, place, and time.  Psychiatric:        Mood and Affect: Mood normal.         Assessment & Plan:  Marland KitchenMarland Kitchen  [name] was seen today for 6 mnth follow-up.  Diagnoses and all orders for this visit:  Overweight (BMI 25.0-29.9) -     AMBULATORY NON FORMULARY MEDICATION; Semaglutide 2.5mg  with pyridoxine 10mg  per mL Inject 1.7mg (68 units) once weekly  Primary hypertension -     AMBULATORY NON FORMULARY MEDICATION; Semaglutide 2.5mg  with pyridoxine 10mg  per mL Inject 1.7mg (68 units) once weekly  Elevated LDL cholesterol level -     AMBULATORY NON FORMULARY MEDICATION; Semaglutide 2.5mg  with pyridoxine 10mg  per mL Inject 1.7mg (68 units) once weekly   Pt is doing great on semaglutide! Keep up the good work. Goal weight is 135lb. Will increase to 1.7mg  and 68 units weekly.  Labs look great Vitals look great, BP at  home reported under 100 systolic continue to watch this and likely will decrease norvasc if needed BMI 25 Follow up in 6 months   Return in about 6 months (around 03/14/2023).    Tandy Gaw, PA-C

## 2022-10-24 ENCOUNTER — Other Ambulatory Visit: Payer: Self-pay | Admitting: Obstetrics and Gynecology

## 2022-10-24 DIAGNOSIS — Z1231 Encounter for screening mammogram for malignant neoplasm of breast: Secondary | ICD-10-CM

## 2022-11-07 NOTE — Progress Notes (Unsigned)
ANNUAL EXAM Patient name: Monica Fry MRN [id number]  Date of birth: 1980/09/10 Chief Complaint:   No chief complaint on file.  History of Present Illness:   Monica Fry is a 42 y.o. G4P2002 female being seen today for a routine annual exam.   IUD placed 06/23/2021 for BTB to help with her painful periods. Has had Dx L/S in the past and did not have endometriosis. She had tried OCPs and POPs in the past. IUD working well for her well.    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 She is married. She has 2 daughters.   No LMP recorded. (Menstrual status: IUD).  MXR: 10/2021 Last Pap/Pap History: 07/2019. Results were: NILM w/ HRHPV negative. H/O abnormal pap: no   Health Maintenance Due  Topic Date Due   INFLUENZA VACCINE  09/21/2022   COVID-19 Vaccine (6 - 2023-24 season) 10/22/2022    Review of Systems:   Pertinent items are noted in HPI Denies any headaches, blurred vision, fatigue, shortness of breath, chest pain, abdominal pain, abnormal vaginal discharge/itching/odor/irritation, problems with periods, bowel movements, urination, or intercourse unless otherwise stated above.  Pertinent History Reviewed:  Reviewed past medical,surgical, social and family history.  Reviewed problem list, medications and allergies. Physical Assessment:  There were no vitals filed for this visit.There is no height or weight on file to calculate BMI.   Physical Examination:  General appearance - well appearing, and in no distress Mental status - alert, oriented to person, place, and time Psych:  She has a normal mood and affect Skin - warm and dry, normal color, no suspicious lesions noted Chest - effort normal Heart - normal rate  Breasts - breasts appear normal, no suspicious masses, no skin or nipple changes or axillary nodes Abdomen - soft, nontender, nondistended, no masses or organomegaly Pelvic -  VULVA: normal appearing vulva with no masses,  tenderness or lesions  VAGINA: normal appearing vagina with normal color and discharge, no lesions  CERVIX: normal appearing cervix without discharge or lesions, no CMT, IUD strings visualized *** UTERUS: uterus is felt to be normal size, shape, consistency and nontender  ADNEXA: No adnexal masses or tenderness noted. Extremities:  No swelling or varicosities noted  Chaperone present for exam  No results found for this or any previous visit (from the past 24 hour(s)).  Assessment & Plan:  Diagnoses and all orders for this visit:  Encounter for annual routine gynecological examination  - Cervical cancer screening: Discussed guidelines. Pap with HPV done 07/2019 - Gardasil:  Declines - STD Testing: Not indicated - Birth Control: Vasectomy - has IUD to help with dysmenorrhea.  - Breast Health: Encouraged self breast awareness/SBE. Teaching provided. Discussed limits of clinical breast exam for detecting breast cancer. Rx given for MXR - F/U 12 months and prn    No orders of the defined types were placed in this encounter.   Meds: No orders of the defined types were placed in this encounter.   Follow-up: No follow-ups on file.  Milas Hock, MD 11/07/2022 2:51 PM

## 2022-11-09 ENCOUNTER — Ambulatory Visit (INDEPENDENT_AMBULATORY_CARE_PROVIDER_SITE_OTHER): Payer: Commercial Managed Care - PPO | Admitting: Obstetrics and Gynecology

## 2022-11-09 ENCOUNTER — Other Ambulatory Visit (HOSPITAL_COMMUNITY)
Admission: RE | Admit: 2022-11-09 | Discharge: 2022-11-09 | Disposition: A | Discharge status: Home / Self Care | Payer: Commercial Managed Care - PPO | Source: Ambulatory Visit | Attending: Obstetrics and Gynecology | Admitting: Obstetrics and Gynecology

## 2022-11-09 ENCOUNTER — Encounter: Payer: Self-pay | Admitting: Obstetrics and Gynecology

## 2022-11-09 VITALS — BP 120/82 | HR 91 | Resp 16 | Ht 63.0 in | Wt 137.0 lb

## 2022-11-09 DIAGNOSIS — Z01419 Encounter for gynecological examination (general) (routine) without abnormal findings: Secondary | ICD-10-CM | POA: Diagnosis present

## 2022-11-16 ENCOUNTER — Ambulatory Visit: Payer: Commercial Managed Care - PPO

## 2022-11-16 LAB — CYTOLOGY - PAP
Comment: NEGATIVE
Diagnosis: NEGATIVE
High risk HPV: NEGATIVE

## 2022-11-18 ENCOUNTER — Encounter: Payer: Self-pay | Admitting: Physician Assistant

## 2022-11-23 ENCOUNTER — Ambulatory Visit: Payer: Commercial Managed Care - PPO

## 2022-11-23 DIAGNOSIS — Z1231 Encounter for screening mammogram for malignant neoplasm of breast: Secondary | ICD-10-CM

## 2023-01-16 ENCOUNTER — Encounter: Payer: Self-pay | Admitting: Family Medicine

## 2023-01-16 ENCOUNTER — Ambulatory Visit: Payer: Commercial Managed Care - PPO

## 2023-01-16 ENCOUNTER — Ambulatory Visit (INDEPENDENT_AMBULATORY_CARE_PROVIDER_SITE_OTHER): Payer: Commercial Managed Care - PPO | Admitting: Family Medicine

## 2023-01-16 VITALS — BP 122/86 | HR 88 | Resp 12 | Ht 63.0 in | Wt 132.1 lb

## 2023-01-16 DIAGNOSIS — S92424A Nondisplaced fracture of distal phalanx of right great toe, initial encounter for closed fracture: Secondary | ICD-10-CM | POA: Diagnosis not present

## 2023-01-16 DIAGNOSIS — S99921A Unspecified injury of right foot, initial encounter: Secondary | ICD-10-CM | POA: Diagnosis not present

## 2023-01-16 NOTE — Progress Notes (Signed)
   Acute Office Visit  Subjective:     Patient ID: Monica Fry, female    DOB: 1981-01-13, 42 y.o.   MRN: [id number]  Chief Complaint  Patient presents with   Toe Injury    HPI Patient is in today for right great toe injury. 5 lb weight fell of coffee table yesterday and landed on her toe.  + swelling. Has been splinting the toe.   ROS      Objective:    BP 122/86   Pulse 88   Resp 12   Ht 5\' 3"  (1.6 m)   Wt 132 lb 1.9 oz (59.9 kg)   SpO2 98%   BMI 23.40 kg/m    Physical Exam Musculoskeletal:     Comments: Bruising over the lateral right great toe some bruising right at the base of the nailbed as well.  Some swelling over the great toe.  Tender over the distal joint space.  Nontender over the first metatarsal.  Mildly tender over the distal first metatarsal head.     No results found for any visits on 01/16/23.      Assessment & Plan:   Problem List Items Addressed This Visit   None Visit Diagnoses     Injury of right great toe, initial encounter    -  Primary   Relevant Orders   DG Toe Great Right   Closed nondisplaced fracture of distal phalanx of right great toe, initial encounter           Right Great Toe fracture-recommend start with a short boot cam walker and then follow-up with Dr. Karie Schwalbe in 1 to 2 weeks.  She would like to order the short boot cam walker off of [employer].  So we did send her home in the postop shoe today and hopefully she can get this in the next couple days.  We did buddy tape the great toe and second toe with gauze in between.  Continue to buddy tape at home until follow-up with Dr. Karie Schwalbe next week.  If improving over several weeks would likely be able to switch to a postop shoe.  And elevate as needed okay to use Tylenol or anti-inflammatory as needed for pain control.  No orders of the defined types were placed in this encounter.   Return in about 9 days (around 01/25/2023) for Dr. Karie Schwalbe for Right great toe fracture. Nani Gasser, MD

## 2023-01-16 NOTE — Progress Notes (Signed)
Pt aware of results. Short cam walker. F/u Dr. Karie Schwalbe next week Avoid direct pressure on big toe

## 2023-01-24 ENCOUNTER — Encounter: Payer: Self-pay | Admitting: Physician Assistant

## 2023-01-24 DIAGNOSIS — I10 Essential (primary) hypertension: Secondary | ICD-10-CM

## 2023-01-24 DIAGNOSIS — E663 Overweight: Secondary | ICD-10-CM

## 2023-01-24 DIAGNOSIS — E7800 Pure hypercholesterolemia, unspecified: Secondary | ICD-10-CM

## 2023-01-24 MED ORDER — AMBULATORY NON FORMULARY MEDICATION: 2 refills | Status: DC

## 2023-01-24 NOTE — Telephone Encounter (Signed)
Please call patient to let her know spoke with [facility] and they received order.

## 2023-01-29 ENCOUNTER — Encounter: Payer: Self-pay | Admitting: Physician Assistant

## 2023-01-30 ENCOUNTER — Encounter: Payer: Self-pay | Admitting: Physician Assistant

## 2023-01-30 ENCOUNTER — Telehealth: Payer: Commercial Managed Care - PPO | Admitting: Physician Assistant

## 2023-01-30 DIAGNOSIS — J329 Chronic sinusitis, unspecified: Secondary | ICD-10-CM | POA: Diagnosis not present

## 2023-01-30 DIAGNOSIS — J40 Bronchitis, not specified as acute or chronic: Secondary | ICD-10-CM | POA: Diagnosis not present

## 2023-01-30 MED ORDER — HYDROCODONE BIT-HOMATROP MBR 5-1.5 MG/5ML PO SOLN
5.0000 mL | Freq: Three times a day (TID) | ORAL | 0 refills | Status: DC | PRN
Start: 2023-01-30 — End: 2023-03-02

## 2023-01-30 MED ORDER — AMOXICILLIN-POT CLAVULANATE 875-125 MG PO TABS
1.0000 | ORAL_TABLET | Freq: Two times a day (BID) | ORAL | 0 refills | Status: DC
Start: 2023-01-30 — End: 2023-03-02

## 2023-01-30 MED ORDER — PREDNISONE 20 MG PO TABS
20.0000 mg | ORAL_TABLET | Freq: Two times a day (BID) | ORAL | 0 refills | Status: DC
Start: 2023-01-30 — End: 2023-03-02

## 2023-01-30 MED ORDER — AIRSUPRA 90-80 MCG/ACT IN AERO
2.0000 | INHALATION_SPRAY | Freq: Four times a day (QID) | RESPIRATORY_TRACT | 0 refills | Status: DC | PRN
Start: 2023-01-30 — End: 2023-06-06

## 2023-01-30 NOTE — Telephone Encounter (Signed)
Patient scheduled.

## 2023-01-30 NOTE — Progress Notes (Signed)
..Virtual Visit via Video Note  I connected with Monica Fry on 01/30/23 at  2:40 PM EST by a video enabled telemedicine application and verified that I am speaking with the correct person using two identifiers.  Location: Patient: home Provider: clinic  .Marland KitchenParticipating in visit:  Patient: Monica Fry Provider: Tandy Gaw PA-C Provider in training: Carlyle Dolly PA-S   I discussed the limitations of evaluation and management by telemedicine and the availability of in person appointments. The patient expressed understanding and agreed to proceed.  History of Present Illness: Pt is a 42 yo female who calls into the clinic with seasonal cough, congestion, sinus pressure and chest tightness. Symptoms for the last 10 days. She has taken dayquil and nyquil around the clock with minimal improvement. No fever, chills, body aches. She has tried her albuterol inhaler but not working well and fears it might be old. Cough is keeping her up at night.   .. 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   White coat syndrome with hypertension 04/10/2016   Migraine with aura and with status migrainosus, not intractable 02/08/2018   Estrogen increased 02/04/2019   Elevated blood pressure reading 10/13/2019   Stress 10/13/2019   Anxiety 10/13/2019   Urticaria 10/13/2019   Tachycardia 10/20/2019   Localized swelling of both hands 10/20/2019   Depressed mood 12/01/2019   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   Panic attacks 11/16/2020   Primary hypertension 02/25/2021   Serum calcium elevated 02/28/2021   Elevated LDL cholesterol level 02/28/2021   Family history of early CAD 03/01/2021   Atypical chest pain 03/01/2021   Bronchitis 12/23/2021    Primary insomnia 03/03/2022   Overweight (BMI 25.0-29.9) 03/03/2022   Resolved Ambulatory Problems    Diagnosis Date Noted   Closed fracture of fifth metatarsal bone of left foot 04/28/2013   Oral contraceptive use 01/08/2014   Bursitis of left shoulder 07/15/2015   DUB (dysfunctional uterine bleeding) 12/01/2019   Past Medical History:  Diagnosis Date   Abnormal Pap smear of cervix 2010   Anemia        Observations/Objective: No acute distress Normal mood  Pale appearance Productive cough Normal breathing   Assessment and Plan: Marland KitchenMarland KitchenDiagnoses and all orders for this visit:  Sinobronchitis -     predniSONE (DELTASONE) 20 MG tablet; Take 1 tablet (20 mg total) by mouth 2 (two) times daily with a meal. -     Albuterol-Budesonide (AIRSUPRA) 90-80 MCG/ACT AERO; Inhale 2 puffs into the lungs every 6 (six) hours as needed. -     amoxicillin-clavulanate (AUGMENTIN) 875-125 MG tablet; Take 1 tablet by mouth 2 (two) times daily. -     HYDROcodone bit-homatropine (HYCODAN) 5-1.5 MG/5ML syrup; Take 5 mLs by mouth every 8 (eight) hours as needed for cough.   Start airsupra and hycodan cough syrup if not improving add prednisone If not improving or mucus production worsening start augmentin Rest and hydrate   Follow Up Instructions:    I discussed the assessment and treatment plan with the patient. The patient was provided an opportunity to ask questions and all were answered. The patient agreed with the plan and demonstrated an understanding of the instructions.   The patient was advised to call back or seek an in-person  evaluation if the symptoms worsen or if the condition fails to improve as anticipated.    Tandy Gaw, PA-C

## 2023-01-31 ENCOUNTER — Encounter: Payer: Self-pay | Admitting: Sports Medicine

## 2023-01-31 ENCOUNTER — Ambulatory Visit (INDEPENDENT_AMBULATORY_CARE_PROVIDER_SITE_OTHER): Payer: Commercial Managed Care - PPO | Admitting: Sports Medicine

## 2023-01-31 ENCOUNTER — Ambulatory Visit: Payer: Commercial Managed Care - PPO

## 2023-01-31 DIAGNOSIS — S92424D Nondisplaced fracture of distal phalanx of right great toe, subsequent encounter for fracture with routine healing: Secondary | ICD-10-CM | POA: Diagnosis not present

## 2023-01-31 DIAGNOSIS — S92401A Displaced unspecified fracture of right great toe, initial encounter for closed fracture: Secondary | ICD-10-CM | POA: Insufficient documentation

## 2023-01-31 NOTE — Progress Notes (Signed)
    Procedures performed today:    None.  Independent interpretation of notes and tests performed by another provider:   None.  Brief History, Exam, Impression, and Recommendations:    Fracture of great toe, right, closed Approximately 1.5 weeks status post fracture distal phalanx right great toe lateral aspect. She has been in a boot, for the most part pain-free. Getting updated x-rays to ensure no further displacement, when we do start to see signs of bony callus we will transition her into a Morton's plate.   Return to see me in approximately 2 weeks.    ____________________________________________ Ihor Austin. Benjamin Stain, M.D., ABFM., CAQSM., AME. Primary Care and Sports Medicine [facility] [facility]  Adjunct Professor of Family Medicine  [employer] [facility] [employer]  Restaurant manager, fast food

## 2023-01-31 NOTE — Assessment & Plan Note (Signed)
Approximately 1.5 weeks status post fracture distal phalanx right great toe lateral aspect. She has been in a boot, for the most part pain-free. Getting updated x-rays to ensure no further displacement, when we do start to see signs of bony callus we will transition her into a Morton's plate.   Return to see me in approximately 2 weeks.

## 2023-02-01 ENCOUNTER — Telehealth: Payer: Self-pay

## 2023-02-01 ENCOUNTER — Encounter: Payer: Self-pay | Admitting: Sports Medicine

## 2023-02-01 NOTE — Telephone Encounter (Signed)
Copied from CRM [id number]. Topic: General - Call Back - No Documentation >> Jan 30, 2023  7:38 AM Gaetano Hawthorne wrote: Reason for CRM: Patient calling back regarding message left - she unfortunately is working out of town today and can't make it in. She has an appointment with Dr. Benjamin Stain for follow up on her toe tomorrow if it's possible to discuss her symptoms then or she'll make a tele-med appointment later on in the week.

## 2023-02-01 NOTE — Telephone Encounter (Signed)
Spoke with patient  and states issue has been resolved.

## 2023-02-15 ENCOUNTER — Ambulatory Visit (INDEPENDENT_AMBULATORY_CARE_PROVIDER_SITE_OTHER): Payer: Commercial Managed Care - PPO | Admitting: Sports Medicine

## 2023-02-15 DIAGNOSIS — S92424D Nondisplaced fracture of distal phalanx of right great toe, subsequent encounter for fracture with routine healing: Secondary | ICD-10-CM

## 2023-02-15 NOTE — Assessment & Plan Note (Signed)
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 She continues in the boot, x-rays last week did not show any bony callus. On exam she has good motion and good strength, only minimal tenderness to palpation at the fracture. Continue the boot for 3 more weeks. We will get an x-ray prior to her visit, orders placed. She does have a trip coming up to [facility] in [place] in the middle of January, she was able to purchase a Morton's plate so even if we do not see full bony callus at the follow-up I will okay her wearing regular shoes with the Morton's plate in place.

## 2023-02-15 NOTE — Progress Notes (Signed)
    Procedures performed today:    None.  Independent interpretation of notes and tests performed by another provider:   None.  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 She continues in the boot, x-rays last week did not show any bony callus. On exam she has good motion and good strength, only minimal tenderness to palpation at the fracture. Continue the boot for 3 more weeks. We will get an x-ray prior to her visit, orders placed. She does have a trip coming up to [facility] in [place] in the middle of January, she was able to purchase a Morton's plate so even if we do not see full bony callus at the follow-up I will okay her wearing regular shoes with the Morton's plate in place.    ____________________________________________ Ihor Austin. Benjamin Stain, M.D., ABFM., CAQSM., AME. Primary Care and Sports Medicine [facility] [facility]  Adjunct Professor of Family Medicine  [employer] [facility] [employer]  Restaurant manager, fast food

## 2023-03-02 ENCOUNTER — Encounter: Payer: Self-pay | Admitting: Physician Assistant

## 2023-03-02 ENCOUNTER — Ambulatory Visit (INDEPENDENT_AMBULATORY_CARE_PROVIDER_SITE_OTHER): Payer: Commercial Managed Care - PPO | Admitting: Physician Assistant

## 2023-03-02 VITALS — BP 107/77 | HR 80 | Ht 63.0 in | Wt 136.0 lb

## 2023-03-02 DIAGNOSIS — R4589 Other symptoms and signs involving emotional state: Secondary | ICD-10-CM

## 2023-03-02 DIAGNOSIS — Z0000 Encounter for general adult medical examination without abnormal findings: Secondary | ICD-10-CM

## 2023-03-02 DIAGNOSIS — F419 Anxiety disorder, unspecified: Secondary | ICD-10-CM | POA: Diagnosis not present

## 2023-03-02 DIAGNOSIS — G43001 Migraine without aura, not intractable, with status migrainosus: Secondary | ICD-10-CM

## 2023-03-02 DIAGNOSIS — I10 Essential (primary) hypertension: Secondary | ICD-10-CM

## 2023-03-02 DIAGNOSIS — F410 Panic disorder [episodic paroxysmal anxiety] without agoraphobia: Secondary | ICD-10-CM

## 2023-03-02 DIAGNOSIS — E7800 Pure hypercholesterolemia, unspecified: Secondary | ICD-10-CM

## 2023-03-02 MED ORDER — CITALOPRAM HYDROBROMIDE 10 MG PO TABS: 10.0000 mg | ORAL_TABLET | Freq: Every day | ORAL | 3 refills | Status: DC

## 2023-03-02 MED ORDER — ALPRAZOLAM 0.5 MG PO TABS: ORAL_TABLET | ORAL | 0 refills | Status: DC

## 2023-03-02 MED ORDER — NARATRIPTAN HCL 2.5 MG PO TABS: ORAL_TABLET | ORAL | 3 refills | Status: DC

## 2023-03-02 MED ORDER — HYDROCHLOROTHIAZIDE 25 MG PO TABS: 25.0000 mg | ORAL_TABLET | Freq: Every day | ORAL | 3 refills | Status: DC

## 2023-03-02 NOTE — Progress Notes (Signed)
 Complete physical exam  Patient: Monica Fry   DOB: 1981-02-10   43 y.o. Female  MRN: [id number]  Subjective:    Chief Complaint  Patient presents with   Annual Exam    Fasting labs     Monica Fry is a 43 y.o. female who presents today for a complete physical exam. She reports consuming a general diet.  Pt uses stationary bike 4-5 times a week due to broken toe.  She generally feels well. She reports sleeping well. She does not have additional problems to discuss today.    Most recent fall risk assessment:    03/02/2023    9:53 AM  Fall Risk   Falls in the past year? 0  Number falls in past yr: 0  Injury with Fall? 0     Most recent depression screenings:    03/02/2023    9:53 AM 09/11/2022    8:30 AM  PHQ 2/9 Scores  PHQ - 2 Score 0 1    Vision:Within last year and Dental: No current dental problems and Receives regular dental care  Patient Active Problem List   Diagnosis Date Noted   Fracture of great toe, right, closed 01/31/2023   Primary insomnia 03/03/2022   Overweight (BMI 25.0-29.9) 03/03/2022   Bronchitis 12/23/2021   Family history of early CAD 03/01/2021   Atypical chest pain 03/01/2021   Serum calcium  elevated 02/28/2021   Elevated LDL cholesterol level 02/28/2021   Primary hypertension 02/25/2021   Panic attacks 11/16/2020   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   Depressed mood 12/01/2019   Tachycardia 10/20/2019   Localized swelling of both hands 10/20/2019   Elevated blood pressure reading 10/13/2019   Stress 10/13/2019   Anxiety 10/13/2019   Urticaria 10/13/2019   Estrogen increased 02/04/2019   Migraine with aura and with status migrainosus, not intractable 02/08/2018   White coat syndrome with hypertension 04/10/2016   Heart murmur on physical examination 02/07/2016   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   History of cervical dysplasia 01/12/2015   H/O motion sickness 11/14/2013   Allergic rhinitis 11/08/2012   Seasonal allergies  11/08/2012   Dysmenorrhea 10/30/2012   Past Medical History:  Diagnosis Date   Abnormal Pap smear of cervix 2010   Anemia    Past Surgical History:  Procedure Laterality Date   CRYOTHERAPY     LAPAROSCOPY  2004   exploratory for endometriosis   Family History  Problem Relation Age of Onset   Diabetes Father    Heart disease Father    Hypertension Father    Hyperlipidemia Father    Cancer Paternal Grandmother        breast and lung   Thrombosis Maternal Grandmother    Depression Maternal Grandmother    Hyperlipidemia Maternal Grandmother    Hypertension Maternal Grandmother    Breast cancer Maternal Grandmother    Heart attack Paternal Grandfather    Hyperlipidemia Paternal Grandfather    Hypertension Paternal Grandfather    No Known Allergies    Patient Care Team: Vernal, Monica Corella L, PA-C as PCP - General (Family Medicine)   Outpatient Medications Prior to Visit  Medication Sig   Albuterol -Budesonide (AIRSUPRA ) 90-80 MCG/ACT AERO Inhale 2 puffs into the lungs every 6 (six) hours as needed.   AMBULATORY NON FORMULARY MEDICATION Semaglutide  2.5mg  with pyridoxine 10mg  per mL Inject 40 units 1.0mg  once weekly   atorvastatin  (LIPITOR) 40 MG tablet Take 1 tablet (40 mg total) by mouth daily. (Patient taking differently: Take 20 mg  by mouth daily.)   cetirizine (ZYRTEC) 10 MG tablet Take 10 mg by mouth daily.   cholecalciferol (VITAMIN D3) 25 MCG (1000 UT) tablet Take 1,000 Units by mouth daily.   cyclobenzaprine  (FLEXERIL ) 10 MG tablet Take 1 tablet (10 mg total) by mouth as needed for muscle spasms.   fluticasone  (FLONASE ) 50 MCG/ACT nasal spray Place 2 sprays into the nose as needed. Reported on 07/15/2015   levonorgestrel  (MIRENA ) 20 MCG/DAY IUD 1 each by Intrauterine route once.   Multiple Vitamin (MULTIVITAMIN ADULT PO) Take by mouth.   mupirocin  ointment (BACTROBAN ) 2 % Apply 1 application topically 3 (three) times daily. Apply to affected area for 7-10 days.    ondansetron  (ZOFRAN ) 8 MG tablet Take 1 tablet (8 mg total) by mouth every 8 (eight) hours as needed for nausea or vomiting.   tretinoin (RETIN-A) 0.05 % cream Apply topically at bedtime.   [DISCONTINUED] ALPRAZolam  (XANAX ) 0.5 MG tablet Take 1/2 tablet (0.25mg ) to 1 tablet (0.5mg ) BY MOUTH up to two times per day as needed for severe anxiety and panic.   [DISCONTINUED] amoxicillin -clavulanate (AUGMENTIN ) 875-125 MG tablet Take 1 tablet by mouth 2 (two) times daily.   [DISCONTINUED] citalopram  (CELEXA ) 10 MG tablet Take 1 tablet (10 mg total) by mouth daily.   [DISCONTINUED] hydrochlorothiazide  (HYDRODIURIL ) 25 MG tablet Take 1 tablet (25 mg total) by mouth daily.   [DISCONTINUED] HYDROcodone  bit-homatropine (HYCODAN) 5-1.5 MG/5ML syrup Take 5 mLs by mouth every 8 (eight) hours as needed for cough.   [DISCONTINUED] naratriptan  (AMERGE) 2.5 MG tablet TAKE ONE TABLET BY MOUTH AT ONSET OF HEADACHE. IF RETURNS OR DOES NOT RESOLVE, MAY REPEAT AFTER FOUR HOURS DO NOT EXCEED 2/24 HOURS   [DISCONTINUED] predniSONE  (DELTASONE ) 20 MG tablet Take 1 tablet (20 mg total) by mouth 2 (two) times daily with a meal. (Patient not taking: Reported on 03/02/2023)   No facility-administered medications prior to visit.    Review of Systems  All other systems reviewed and are negative.         Objective:     BP 107/77   Pulse 80   Ht 5' 3 (1.6 m)   Wt 136 lb (61.7 kg)   SpO2 99%   BMI 24.09 kg/m  BP Readings from Last 3 Encounters:  03/02/23 107/77  01/16/23 122/86  11/09/22 120/82   Wt Readings from Last 3 Encounters:  03/02/23 136 lb (61.7 kg)  01/16/23 132 lb 1.9 oz (59.9 kg)  11/09/22 137 lb (62.1 kg)      Physical Exam  BP 107/77   Pulse 80   Ht 5' 3 (1.6 m)   Wt 136 lb (61.7 kg)   SpO2 99%   BMI 24.09 kg/m   General Appearance:    Alert, cooperative, no distress, appears stated age  Head:    Normocephalic, without obvious abnormality, atraumatic  Eyes:    PERRL,  conjunctiva/corneas clear, EOM's intact, fundi    benign, both eyes  Ears:    Normal TM's and external ear canals, both ears  Nose:   Nares normal, septum midline, mucosa normal, no drainage    or sinus tenderness  Throat:   Lips, mucosa, and tongue normal; teeth and gums normal  Neck:   Supple, symmetrical, trachea midline, no adenopathy;    thyroid:  no enlargement/tenderness/nodules; no carotid   bruit or JVD  Back:     Symmetric, no curvature, ROM normal, no CVA tenderness  Lungs:     Clear to auscultation bilaterally, respirations unlabored  Chest Wall:    No tenderness or deformity   Heart:    Regular rate and rhythm, S1 and S2 normal, no murmur, rub   or gallop     Abdomen:     Soft, non-tender, bowel sounds active all four quadrants,    no masses, no organomegaly        Extremities:   Extremities normal, atraumatic, no cyanosis or edema. Left cam boot  Pulses:   2+ and symmetric all extremities  Skin:   Skin color, texture, turgor normal, no rashes or lesions  Lymph nodes:   Cervical, supraclavicular, and axillary nodes normal  Neurologic:   CNII-XII intact, normal strength, sensation and reflexes    throughout      Assessment & Plan:    Routine Health Maintenance and Physical Exam  Immunization History  Administered Date(s) Administered   Influenza Split 12/06/2010, 12/13/2011   Influenza,inj,Quad PF,6+ Mos 10/30/2012, 11/14/2013, 11/13/2018, 10/13/2019   Influenza-Unspecified 11/23/2014, 11/29/2016, 11/11/2020   Moderna SARS-COV2 Booster Vaccination 11/11/2020   Moderna Sars-Covid-2 Vaccination 02/17/2019, 03/17/2019, 12/20/2019   PFIZER(Purple Top)SARS-COV-2 Vaccination 11/23/2020   Tdap 11/14/2013    Health Maintenance  Topic Date Due   COVID-19 Vaccine (6 - 2024-25 season) 03/18/2023 (Originally 10/22/2022)   INFLUENZA VACCINE  05/21/2023 (Originally 09/21/2022)   DTaP/Tdap/Td (2 - Td or Tdap) 11/15/2023   MAMMOGRAM  11/23/2023   Cervical Cancer Screening  (HPV/Pap Cotest)  11/09/2027   Hepatitis C Screening  Completed   HIV Screening  Completed   HPV VACCINES  Aged Out    Discussed health benefits of physical activity, and encouraged her to engage in regular exercise appropriate for her age and condition.  SABRALari was seen today for annual exam.  Diagnoses and all orders for this visit:  Routine physical examination -     CBC w/Diff/Platelet -     Lipid panel -     CMP14+EGFR -     TSH -     VITAMIN D  25 Hydroxy (Vit-D Deficiency, Fractures)  Anxiety -     citalopram  (CELEXA ) 10 MG tablet; Take 1 tablet (10 mg total) by mouth daily.  Depressed mood -     citalopram  (CELEXA ) 10 MG tablet; Take 1 tablet (10 mg total) by mouth daily.  Primary hypertension -     CMP14+EGFR -     hydrochlorothiazide  (HYDRODIURIL ) 25 MG tablet; Take 1 tablet (25 mg total) by mouth daily.  Migraine without aura and with status migrainosus, not intractable -     naratriptan  (AMERGE) 2.5 MG tablet; TAKE ONE TABLET BY MOUTH AT ONSET OF HEADACHE. IF RETURNS OR DOES NOT RESOLVE, MAY REPEAT AFTER FOUR HOURS DO NOT EXCEED 2/24 HOURS  Panic attacks -     ALPRAZolam  (XANAX ) 0.5 MG tablet; Take 1/2 tablet (0.25mg ) to 1 tablet (0.5mg ) BY MOUTH up to two times per day as needed for severe anxiety and panic.  Elevated LDL cholesterol level -     Lipid panel   .SABRA Discussed 150 minutes of exercise a week.  Encouraged vitamin D  1000 units and Calcium  1300mg  or 4 servings of dairy a day.  Fasting labs ordered PHQ no concerns-refilled celexa  Pap and mammogram UTD Refilled medications Cut hydrochlorothiazide  1/2 tablet daily due to lower BP today Lost 30 lbs on compounded semaglutide  she is titration down Follow up in 6 months  Return in about 6 months (around 08/30/2023).     Falicia Lizotte, PA-C

## 2023-03-02 NOTE — Patient Instructions (Signed)

## 2023-03-04 LAB — CBC WITH DIFFERENTIAL/PLATELET
Basophils Absolute: 0.1 10*3/uL (ref 0.0–0.2)
Basos: 1 %
EOS (ABSOLUTE): 0.1 10*3/uL (ref 0.0–0.4)
Eos: 1 %
Hematocrit: 42.9 % (ref 34.0–46.6)
Hemoglobin: 14.1 g/dL (ref 11.1–15.9)
Immature Grans (Abs): 0 10*3/uL (ref 0.0–0.1)
Immature Granulocytes: 0 %
Lymphocytes Absolute: 1.5 10*3/uL (ref 0.7–3.1)
Lymphs: 22 %
MCH: 31.5 pg (ref 26.6–33.0)
MCHC: 32.9 g/dL (ref 31.5–35.7)
MCV: 96 fL (ref 79–97)
Monocytes Absolute: 0.5 10*3/uL (ref 0.1–0.9)
Monocytes: 7 %
Neutrophils Absolute: 4.9 10*3/uL (ref 1.4–7.0)
Neutrophils: 69 %
Platelets: 220 10*3/uL (ref 150–450)
RBC: 4.48 x10E6/uL (ref 3.77–5.28)
RDW: 12.5 % (ref 11.7–15.4)
WBC: 7.1 10*3/uL (ref 3.4–10.8)

## 2023-03-04 LAB — CMP14+EGFR
ALT: 17 [IU]/L (ref 0–32)
AST: 21 [IU]/L (ref 0–40)
Albumin: 4.7 g/dL (ref 3.9–4.9)
Alkaline Phosphatase: 61 [IU]/L (ref 44–121)
BUN/Creatinine Ratio: 17 (ref 9–23)
BUN: 15 mg/dL (ref 6–24)
Bilirubin Total: 0.5 mg/dL (ref 0.0–1.2)
CO2: 26 mmol/L (ref 20–29)
Calcium: 9.6 mg/dL (ref 8.7–10.2)
Chloride: 99 mmol/L (ref 96–106)
Creatinine, Ser: 0.88 mg/dL (ref 0.57–1.00)
Globulin, Total: 2.2 g/dL (ref 1.5–4.5)
Glucose: 76 mg/dL (ref 70–99)
Potassium: 3.5 mmol/L (ref 3.5–5.2)
Sodium: 139 mmol/L (ref 134–144)
Total Protein: 6.9 g/dL (ref 6.0–8.5)
eGFR: 84 mL/min/{1.73_m2} (ref >59)

## 2023-03-04 LAB — LIPID PANEL
Chol/HDL Ratio: 2 {ratio} (ref 0.0–4.4)
Cholesterol, Total: 131 mg/dL (ref 100–199)
HDL: 66 mg/dL (ref >39)
LDL Chol Calc (NIH): 53 mg/dL (ref 0–99)
Triglycerides: 52 mg/dL (ref 0–149)
VLDL Cholesterol Cal: 12 mg/dL (ref 5–40)

## 2023-03-04 LAB — VITAMIN D 25 HYDROXY (VIT D DEFICIENCY, FRACTURES): Vit D, 25-Hydroxy: 65.4 ng/mL (ref 30.0–100.0)

## 2023-03-04 LAB — TSH: TSH: 0.784 u[IU]/mL (ref 0.450–4.500)

## 2023-03-05 NOTE — Progress Notes (Signed)
 Sharell,   Vitamin D  looks GREAT! Thyroid normal range.  Kidney, liver, glucose looks great.  Cholesterol looks GREAT.   10 year cardiovascular risk very low. GREAT news.   .The 10-year ASCVD risk score (Arnett DK, et al., 2019) is: 0.2%   Values used to calculate the score:     Age: 43 years     Sex: Female     Is Non-Hispanic African American: No     Diabetic: No     Tobacco smoker: No     Systolic Blood Pressure: 107 mmHg     Is BP treated: Yes     HDL Cholesterol: 66 mg/dL     Total Cholesterol: 131 mg/dL  Labs look good.

## 2023-03-08 ENCOUNTER — Ambulatory Visit: Payer: Commercial Managed Care - PPO

## 2023-03-08 ENCOUNTER — Ambulatory Visit (INDEPENDENT_AMBULATORY_CARE_PROVIDER_SITE_OTHER): Payer: Commercial Managed Care - PPO | Admitting: Sports Medicine

## 2023-03-08 DIAGNOSIS — S92424D Nondisplaced fracture of distal phalanx of right great toe, subsequent encounter for fracture with routine healing: Secondary | ICD-10-CM

## 2023-03-08 NOTE — Progress Notes (Signed)
    Procedures performed today:    None.  Independent interpretation of notes and tests performed by another provider:   None.  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 Morton's plate is doing well, she has good signs of healing on x-rays today. She is going to [facility] and [place], I have encouraged her to continue to wear the Morton's plate when out and about and she can return to see me as needed.    ____________________________________________ Ihor Austin. Benjamin Stain, M.D., ABFM., CAQSM., AME. Primary Care and Sports Medicine [place] [facility]  Adjunct Professor of Family Medicine  [employer] [facility] [employer]  Restaurant manager, fast food

## 2023-03-08 NOTE — Assessment & Plan Note (Signed)
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 Morton's plate is doing well, she has good signs of healing on x-rays today. She is going to [facility] and [place], I have encouraged her to continue to wear the Morton's plate when out and about and she can return to see me as needed.

## 2023-03-14 ENCOUNTER — Ambulatory Visit: Payer: Commercial Managed Care - PPO | Admitting: Physician Assistant

## 2023-03-26 ENCOUNTER — Other Ambulatory Visit: Payer: Self-pay | Admitting: Physician Assistant

## 2023-06-05 ENCOUNTER — Other Ambulatory Visit: Payer: Self-pay | Admitting: Physician Assistant

## 2023-06-05 DIAGNOSIS — J329 Chronic sinusitis, unspecified: Secondary | ICD-10-CM

## 2023-06-14 ENCOUNTER — Other Ambulatory Visit: Payer: Self-pay | Admitting: Physician Assistant

## 2023-06-14 DIAGNOSIS — G43001 Migraine without aura, not intractable, with status migrainosus: Secondary | ICD-10-CM

## 2023-08-03 ENCOUNTER — Ambulatory Visit: Payer: Self-pay | Admitting: Sports Medicine

## 2023-08-03 ENCOUNTER — Ambulatory Visit (INDEPENDENT_AMBULATORY_CARE_PROVIDER_SITE_OTHER): Admitting: Sports Medicine

## 2023-08-03 ENCOUNTER — Ambulatory Visit (INDEPENDENT_AMBULATORY_CARE_PROVIDER_SITE_OTHER)

## 2023-08-03 VITALS — BP 133/97 | HR 92 | Wt 143.0 lb

## 2023-08-03 DIAGNOSIS — R0781 Pleurodynia: Secondary | ICD-10-CM | POA: Diagnosis not present

## 2023-08-03 DIAGNOSIS — R0789 Other chest pain: Secondary | ICD-10-CM | POA: Insufficient documentation

## 2023-08-03 MED ORDER — OXYCODONE-ACETAMINOPHEN 10-325 MG PO TABS: 1.0000 | ORAL_TABLET | Freq: Three times a day (TID) | ORAL | 0 refills | Status: DC | PRN

## 2023-08-03 NOTE — Assessment & Plan Note (Signed)
 Interesting pain right anterior chest approximately the fourth rib midclavicular line to parasternal after a fall 3 weeks ago in [place]. The rest of the chest exam including auscultation is clear, adding a rib belt, Percocet, x-rays. Return to see me in about 3 weeks.

## 2023-08-03 NOTE — Progress Notes (Signed)
    Procedures performed today:    None.  Independent interpretation of notes and tests performed by another provider:   None.  Brief History, Exam, Impression, and Recommendations:    Rib pain on right side Interesting pain right anterior chest approximately the fourth rib midclavicular line to parasternal after a fall 3 weeks ago in [place]. The rest of the chest exam including auscultation is clear, adding a rib belt, Percocet, x-rays. Return to see me in about 3 weeks.    ____________________________________________ Joselyn Nicely. Sandy Crumb, M.D., ABFM., CAQSM., AME. Primary Care and Sports Medicine [place] [facility]  Adjunct Professor of [employer] [facility] [employer] [facility] Medicine  [employer]  Restaurant manager, fast food

## 2023-08-16 ENCOUNTER — Encounter: Payer: Self-pay | Admitting: Sports Medicine

## 2023-08-23 ENCOUNTER — Ambulatory Visit: Admitting: Sports Medicine

## 2023-09-06 ENCOUNTER — Encounter: Payer: Self-pay | Admitting: Physician Assistant

## 2023-09-07 MED ORDER — CIPROFLOXACIN HCL 500 MG PO TABS: 500.0000 mg | ORAL_TABLET | Freq: Two times a day (BID) | ORAL | 0 refills | Status: DC

## 2023-10-23 ENCOUNTER — Encounter: Payer: Self-pay | Admitting: Sports Medicine

## 2023-11-06 ENCOUNTER — Encounter: Payer: Self-pay | Admitting: Obstetrics and Gynecology

## 2023-11-06 ENCOUNTER — Other Ambulatory Visit: Payer: Self-pay

## 2023-11-06 DIAGNOSIS — R300 Dysuria: Secondary | ICD-10-CM

## 2023-11-06 MED ORDER — CIPROFLOXACIN HCL 250 MG PO TABS: 250.0000 mg | ORAL_TABLET | Freq: Two times a day (BID) | ORAL | 0 refills | Status: AC

## 2023-12-24 ENCOUNTER — Encounter: Payer: Self-pay | Admitting: Physician Assistant

## 2023-12-24 ENCOUNTER — Ambulatory Visit (INDEPENDENT_AMBULATORY_CARE_PROVIDER_SITE_OTHER): Admitting: Physician Assistant

## 2023-12-24 VITALS — BP 136/83 | HR 112 | Temp 98.2°F | Ht 63.0 in | Wt 136.0 lb

## 2023-12-24 DIAGNOSIS — Z23 Encounter for immunization: Secondary | ICD-10-CM

## 2023-12-24 DIAGNOSIS — J329 Chronic sinusitis, unspecified: Secondary | ICD-10-CM | POA: Diagnosis not present

## 2023-12-24 DIAGNOSIS — J40 Bronchitis, not specified as acute or chronic: Secondary | ICD-10-CM

## 2023-12-24 MED ORDER — AIRSUPRA 90-80 MCG/ACT IN AERO: 2.0000 | INHALATION_SPRAY | Freq: Four times a day (QID) | RESPIRATORY_TRACT | 0 refills | Status: AC | PRN

## 2023-12-24 MED ORDER — PREDNISONE 20 MG PO TABS: 20.0000 mg | ORAL_TABLET | Freq: Every day | ORAL | 0 refills | Status: DC

## 2023-12-24 MED ORDER — HYDROCODONE BIT-HOMATROP MBR 5-1.5 MG/5ML PO SOLN: 5.0000 mL | Freq: Three times a day (TID) | ORAL | 0 refills | Status: DC | PRN

## 2023-12-24 MED ORDER — AZITHROMYCIN 250 MG PO TABS: ORAL_TABLET | ORAL | 0 refills | Status: DC

## 2023-12-24 NOTE — Patient Instructions (Signed)

## 2023-12-24 NOTE — Progress Notes (Unsigned)
 Acute Office Visit  Subjective:     Patient ID: Monica Fry, female    DOB: [birth year], 43 y.o.   MRN: [id number]  Chief Complaint  Patient presents with   Cough    Possible sinobronchitis onset since Monday , cough congestion,sinus pressure, jaw pain    Patient is a 43 yo female presenting with acute bronchitis symptoms since 10/27. She complains of post-nasal drip, sinus pressure, a dry cough, and generalized malaise. She states these symptoms occur twice a year usually and does not feel any different than the usual symptoms. Pt currently uses an AirSupra  inhaler for some relief of the cough and morning mucosal drainage. She denies any fever, body aches, chest pain, wheezing, nausea, and vomiting.   Review of Systems  Constitutional:  Positive for malaise/fatigue. Negative for fever.  HENT:  Positive for sinus pain. Negative for sore throat.   Respiratory:  Positive for cough and sputum production. Negative for hemoptysis, shortness of breath, wheezing and stridor.   Cardiovascular:  Negative for chest pain.  Gastrointestinal:  Negative for nausea and vomiting.  Musculoskeletal:  Negative for myalgias.  All other systems reviewed and are negative.       Objective:    BP 136/83   Pulse (!) 112   Temp 98.2 F (36.8 C) (Oral)   Ht 5' 3 (1.6 m)   Wt 61.7 kg   SpO2 99%   BMI 24.09 kg/m  {Vitals History (Optional):23777}  Physical Exam Vitals reviewed.  Constitutional:      Appearance: Normal appearance. She is normal weight.  HENT:     Head: Normocephalic and atraumatic.     Right Ear: Tympanic membrane, ear canal and external ear normal.     Left Ear: Tympanic membrane, ear canal and external ear normal.     Nose: Nose normal. No congestion.     Mouth/Throat:     Mouth: Mucous membranes are moist.     Pharynx: Oropharynx is clear. Posterior oropharyngeal erythema and postnasal drip present.  Cardiovascular:     Rate and Rhythm: Normal rate and regular  rhythm.     Heart sounds: Normal heart sounds. No murmur heard.    No friction rub. No gallop.  Pulmonary:     Effort: Pulmonary effort is normal.     Breath sounds: Normal breath sounds.     Comments: Pt had small wheeze with intermittent cough during PE.  Musculoskeletal:     Cervical back: No tenderness.  Lymphadenopathy:     Cervical: No cervical adenopathy.  Neurological:     General: No focal deficit present.     Mental Status: She is alert and oriented to person, place, and time. Mental status is at baseline.  Psychiatric:        Mood and Affect: Mood normal.        Behavior: Behavior normal.        Thought Content: Thought content normal.        Judgment: Judgment normal.     No results found for any visits on 12/24/23.      Assessment & Plan:   Problem List Items Addressed This Visit       Respiratory   Sinobronchitis - Primary   Heard a small wheeze on auscultation when coughing today.  Start Azithromycin  for possible sinus infection  Start Prednisone  to help with inflammation  Continue using AirSupra  for symptom management  Use Hycodan as needed for cough suppression   If symptoms worsen, please return  to the office for further work up.       Relevant Medications   azithromycin  (ZITHROMAX  Z-PAK) 250 MG tablet   predniSONE  (DELTASONE ) 20 MG tablet   HYDROcodone  bit-homatropine (HYCODAN) 5-1.5 MG/5ML syrup   Albuterol -Budesonide (AIRSUPRA ) 90-80 MCG/ACT AERO   Other Visit Diagnoses       Immunization due       Relevant Orders   Flu vaccine trivalent PF, 6mos and older(Flulaval,Afluria,Fluarix,Fluzone) (Completed)       Meds ordered this encounter  Medications   azithromycin  (ZITHROMAX  Z-PAK) 250 MG tablet    Sig: Take 2 tablets (500 mg) on  Day 1,  followed by 1 tablet (250 mg) once daily on Days 2 through 5.    Dispense:  6 tablet    Refill:  0    Supervising Provider:   METHENEY, CATHERINE D [[id number]]   predniSONE  (DELTASONE ) 20 MG tablet     Sig: Take 1 tablet (20 mg total) by mouth daily with breakfast.    Dispense:  10 tablet    Refill:  0    Supervising Provider:   METHENEY, CATHERINE D [[id number]]   HYDROcodone  bit-homatropine (HYCODAN) 5-1.5 MG/5ML syrup    Sig: Take 5 mLs by mouth every 8 (eight) hours as needed for cough.    Dispense:  120 mL    Refill:  0    Supervising Provider:   METHENEY, CATHERINE D [[id number]]   Albuterol -Budesonide (AIRSUPRA ) 90-80 MCG/ACT AERO    Sig: Inhale 2 puffs into the lungs every 6 (six) hours as needed.    Dispense:  31.2 g    Refill:  0    Supervising Provider:   METHENEY, CATHERINE D [[id number]]    No follow-ups on file.  Destine K Riggins, Student-PA

## 2023-12-24 NOTE — Assessment & Plan Note (Signed)
 Heard a small wheeze on auscultation when coughing today.  Start Azithromycin  for possible sinus infection  Start Prednisone  to help with inflammation  Continue using AirSupra  for symptom management  Use Hycodan as needed for cough suppression   If symptoms worsen, please return to the office for further work up.

## 2023-12-25 ENCOUNTER — Encounter: Payer: Self-pay | Admitting: Physician Assistant

## 2024-02-27 ENCOUNTER — Other Ambulatory Visit: Payer: Self-pay | Admitting: Medical Genetics

## 2024-03-04 ENCOUNTER — Encounter: Payer: Self-pay | Admitting: Physician Assistant

## 2024-03-04 ENCOUNTER — Other Ambulatory Visit: Payer: Self-pay | Admitting: Obstetrics and Gynecology

## 2024-03-04 ENCOUNTER — Ambulatory Visit: Admitting: Physician Assistant

## 2024-03-04 VITALS — BP 120/85 | HR 84 | Ht 63.0 in | Wt 136.1 lb

## 2024-03-04 DIAGNOSIS — Z23 Encounter for immunization: Secondary | ICD-10-CM

## 2024-03-04 DIAGNOSIS — Z79899 Other long term (current) drug therapy: Secondary | ICD-10-CM | POA: Diagnosis not present

## 2024-03-04 DIAGNOSIS — I10 Essential (primary) hypertension: Secondary | ICD-10-CM

## 2024-03-04 DIAGNOSIS — E7800 Pure hypercholesterolemia, unspecified: Secondary | ICD-10-CM

## 2024-03-04 DIAGNOSIS — Z8249 Family history of ischemic heart disease and other diseases of the circulatory system: Secondary | ICD-10-CM | POA: Diagnosis not present

## 2024-03-04 DIAGNOSIS — G43001 Migraine without aura, not intractable, with status migrainosus: Secondary | ICD-10-CM | POA: Diagnosis not present

## 2024-03-04 DIAGNOSIS — F419 Anxiety disorder, unspecified: Secondary | ICD-10-CM

## 2024-03-04 DIAGNOSIS — R4589 Other symptoms and signs involving emotional state: Secondary | ICD-10-CM | POA: Diagnosis not present

## 2024-03-04 DIAGNOSIS — G43101 Migraine with aura, not intractable, with status migrainosus: Secondary | ICD-10-CM

## 2024-03-04 DIAGNOSIS — Z1231 Encounter for screening mammogram for malignant neoplasm of breast: Secondary | ICD-10-CM

## 2024-03-04 DIAGNOSIS — Z0000 Encounter for general adult medical examination without abnormal findings: Secondary | ICD-10-CM

## 2024-03-04 DIAGNOSIS — R110 Nausea: Secondary | ICD-10-CM | POA: Diagnosis not present

## 2024-03-04 DIAGNOSIS — F410 Panic disorder [episodic paroxysmal anxiety] without agoraphobia: Secondary | ICD-10-CM | POA: Diagnosis not present

## 2024-03-04 DIAGNOSIS — Z8639 Personal history of other endocrine, nutritional and metabolic disease: Secondary | ICD-10-CM

## 2024-03-04 DIAGNOSIS — Z131 Encounter for screening for diabetes mellitus: Secondary | ICD-10-CM | POA: Diagnosis not present

## 2024-03-04 MED ORDER — CITALOPRAM HYDROBROMIDE 10 MG PO TABS: 10.0000 mg | ORAL_TABLET | Freq: Every day | ORAL | 3 refills | Status: AC

## 2024-03-04 MED ORDER — ALPRAZOLAM 0.5 MG PO TABS: ORAL_TABLET | ORAL | 0 refills | Status: AC

## 2024-03-04 MED ORDER — HYDROCHLOROTHIAZIDE 25 MG PO TABS: 25.0000 mg | ORAL_TABLET | Freq: Every day | ORAL | 3 refills | Status: AC

## 2024-03-04 MED ORDER — CYCLOBENZAPRINE HCL 10 MG PO TABS: 10.0000 mg | ORAL_TABLET | ORAL | 2 refills | Status: AC | PRN

## 2024-03-04 MED ORDER — ONDANSETRON HCL 8 MG PO TABS: 8.0000 mg | ORAL_TABLET | Freq: Three times a day (TID) | ORAL | 2 refills | Status: AC | PRN

## 2024-03-04 MED ORDER — NARATRIPTAN HCL 2.5 MG PO TABS: ORAL_TABLET | ORAL | 3 refills | Status: AC

## 2024-03-04 NOTE — Patient Instructions (Signed)

## 2024-03-04 NOTE — Progress Notes (Signed)
 "  Complete physical exam  Patient: Monica Fry   DOB: November 05, 1980   44 y.o. Female  MRN: [id number]  Subjective:    Chief Complaint  Patient presents with   Annual Exam    Patient states Ob-Gyn usually handles her mammogram orders but she is not sure if she has had one recently.     SABRA.Discussed the use of AI scribe software for clinical note transcription with the patient, who gave verbal consent to proceed.  History of Present Illness Monica Fry is a 44 year old female who presents for medication management and CPE.   Hypertension - Monitors blood pressure at home; systolic readings generally within target range - Diastolic readings occasionally reach the nineties - Taking hydrochlorothiazide  regularly - No chest pain, shortness of breath, swelling, or swallowing difficulties  Hyperlipidemia - Recently tapered off Lipitor a few weeks ago  Weight management - Lost approximately 30 pounds, from 164 pounds to a low of 128 pounds - Gained about 10 pounds back over the summer - Using semaglutide  for weight loss; initially weekly for 2-3 months, now every two weeks at 1.25 mg - Obtains semaglutide  from a clinic at $100 per vial - Engages in weight lifting and walks when weather permits  Migraine - Experiences migraines, particularly during seasonal changes - Three episodes in the last month - Uses naratriptan  for rescue treatment  Mood disorder - Currently taking Celexa  and requires a refill - Has not refilled Xanax  in a year     Most recent fall risk assessment:    03/04/2024    8:21 AM  Fall Risk   Falls in the past year? 1  Number falls in past yr: 0  Injury with Fall? 1  Risk for fall due to : History of fall(s)  Follow up Falls evaluation completed     Most recent depression screenings:    03/04/2024    8:21 AM 12/24/2023    8:54 AM  PHQ 2/9 Scores  PHQ - 2 Score 0 0  PHQ- 9 Score 2     Vision:Within last year and Dental: No current dental  problems and Receives regular dental care  Patient Active Problem List   Diagnosis Date Noted   Sinobronchitis 12/24/2023   Rib pain on right side 08/03/2023   Fracture of great toe, right, closed 01/31/2023   Primary insomnia 03/03/2022   Overweight (BMI 25.0-29.9) 03/03/2022   Bronchitis 12/23/2021   Family history of early CAD 03/01/2021   Atypical chest pain 03/01/2021   Serum calcium  elevated 02/28/2021   Elevated LDL cholesterol level 02/28/2021   Primary hypertension 02/25/2021   Panic attacks 11/16/2020   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   Depressed mood 12/01/2019   Tachycardia 10/20/2019   Localized swelling of both hands 10/20/2019   Elevated blood pressure reading 10/13/2019   Stress 10/13/2019   Anxiety 10/13/2019   Urticaria 10/13/2019   Estrogen increased 02/04/2019   Migraine with aura and with status migrainosus, not intractable 02/08/2018   White coat syndrome with hypertension 04/10/2016   Heart murmur on physical examination 02/07/2016   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   History of cervical dysplasia 01/12/2015   H/O motion sickness 11/14/2013   Allergic rhinitis 11/08/2012   Seasonal allergies 11/08/2012   Dysmenorrhea 10/30/2012   Past Medical History:  Diagnosis Date   Abnormal Pap smear of cervix 02/21/2008   Anemia    Anxiety 09/2019   COPD (chronic obstructive pulmonary disease) (HCC) 04/2017   Brinchitis fall  and spring   Depression 09/2019   Hyperlipidemia    Hypertension    Past Surgical History:  Procedure Laterality Date   CRYOTHERAPY     LAPAROSCOPY  2004   exploratory for endometriosis   Family History  Problem Relation Age of Onset   Diabetes Father    Heart disease Father    Hypertension Father    Hyperlipidemia Father    Cancer Paternal Grandmother        breast and lung   Thrombosis Maternal Grandmother    Depression Maternal Grandmother    Hyperlipidemia Maternal Grandmother    Hypertension Maternal Grandmother     Breast cancer Maternal Grandmother    COPD Maternal Grandmother    Heart disease Maternal Grandmother    Heart attack Paternal Grandfather    Hyperlipidemia Paternal Grandfather    Hypertension Paternal Grandfather    Heart disease Paternal Grandfather    Hyperlipidemia Mother    Allergies[1]    Patient Care Team: Sabriel Borromeo L, PA-C as PCP - General (Family Medicine)   Show/hide medication list[2]  ROS See HPI.     Objective:     BP 120/85 (Cuff Size: Normal)   Pulse 84   Ht 5' 3 (1.6 m)   Wt 136 lb 2 oz (61.7 kg)   SpO2 100%   BMI 24.11 kg/m  BP Readings from Last 3 Encounters:  03/04/24 120/85  12/24/23 136/83  08/03/23 (!) 133/97   Wt Readings from Last 3 Encounters:  03/04/24 136 lb 2 oz (61.7 kg)  12/24/23 136 lb (61.7 kg)  08/03/23 143 lb (64.9 kg)      Physical Exam  BP 120/85 (Cuff Size: Normal)   Pulse 84   Ht 5' 3 (1.6 m)   Wt 136 lb 2 oz (61.7 kg)   SpO2 100%   BMI 24.11 kg/m   General Appearance:    Alert, cooperative, no distress, appears stated age  Head:    Normocephalic, without obvious abnormality, atraumatic  Eyes:    PERRL, conjunctiva/corneas clear, EOM's intact, fundi    benign, both eyes  Ears:    Normal TM's and external ear canals, both ears  Nose:   Nares normal, septum midline, mucosa normal, no drainage    or sinus tenderness  Throat:   Lips, mucosa, and tongue normal; teeth and gums normal  Neck:   Supple, symmetrical, trachea midline, no adenopathy;    thyroid:  no enlargement/tenderness/nodules; no carotid   bruit or JVD  Back:     Symmetric, no curvature, ROM normal, no CVA tenderness  Lungs:     Clear to auscultation bilaterally, respirations unlabored  Chest Wall:    No tenderness or deformity   Heart:    Regular rate and rhythm, S1 and S2 normal, no murmur, rub   or gallop     Abdomen:     Soft, non-tender, bowel sounds active all four quadrants,    no masses, no organomegaly        Extremities:    Extremities normal, atraumatic, no cyanosis or edema  Pulses:   2+ and symmetric all extremities  Skin:   Skin color, texture, turgor normal, no rashes or lesions  Lymph nodes:   Cervical, supraclavicular, and axillary nodes normal  Neurologic:   CNII-XII intact, normal strength, sensation and reflexes    throughout      Assessment & Plan:    Routine Health Maintenance and Physical Exam  Immunization History  Administered Date(s) Administered  Influenza Split 12/06/2010, 12/13/2011   Influenza, Seasonal, Injecte, Preservative Fre 12/24/2023   Influenza,inj,Quad PF,6+ Mos 10/30/2012, 11/14/2013, 11/13/2018, 10/13/2019   Influenza-Unspecified 11/23/2014, 11/29/2016, 11/11/2020   Moderna SARS-COV2 Booster Vaccination 11/11/2020   Moderna Sars-Covid-2 Vaccination 02/17/2019, 03/17/2019, 12/20/2019   PFIZER(Purple Top)SARS-COV-2 Vaccination 11/23/2020   Tdap 11/14/2013, 03/04/2024    Health Maintenance  Topic Date Due   Mammogram  11/23/2023   COVID-19 Vaccine (4 - 2025-26 season) 03/20/2024 (Originally 10/22/2023)   Hepatitis B Vaccines 19-59 Average Risk (1 of 3 - 19+ 3-dose series) 12/23/2024 (Originally 09/07/1998)   HPV VACCINES (1 - Risk 3-dose SCDM series) 12/23/2024 (Originally 09/07/2006)   Cervical Cancer Screening (HPV/Pap Cotest)  11/09/2027   DTaP/Tdap/Td (3 - Td or Tdap) 03/04/2034   Influenza Vaccine  Completed   Hepatitis C Screening  Completed   HIV Screening  Completed   Pneumococcal Vaccine  Aged Out   Meningococcal B Vaccine  Aged Out    Discussed health benefits of physical activity, and encouraged her to engage in regular exercise appropriate for her age and condition. SABRALari was seen today for annual exam.  Diagnoses and all orders for this visit:  Routine physical examination -     Lipid panel -     CMP14+EGFR -     TSH + free T4 -     CBC w/Diff/Platelet -     VITAMIN D  25 Hydroxy (Vit-D Deficiency, Fractures)  Anxiety -     citalopram  (CELEXA )  10 MG tablet; Take 1 tablet (10 mg total) by mouth daily.  Depressed mood -     citalopram  (CELEXA ) 10 MG tablet; Take 1 tablet (10 mg total) by mouth daily.  Migraine without aura and with status migrainosus, not intractable -     naratriptan  (AMERGE) 2.5 MG tablet; TAKE ONE TABLET BY MOUTH AT ONSET OF HEADACHE. IF RETURNS OR DOES NOT RESOLVE, MAY REPEAT AFTER FOUR HOURS DO NOT EXCEED 2/24 HOURS  Panic attacks -     ALPRAZolam  (XANAX ) 0.5 MG tablet; Take 1/2 tablet (0.25mg ) to 1 tablet (0.5mg ) BY MOUTH up to two times per day as needed for severe anxiety and panic.  Primary hypertension -     hydrochlorothiazide  (HYDRODIURIL ) 25 MG tablet; Take 1 tablet (25 mg total) by mouth daily. -     CMP14+EGFR  White coat syndrome with hypertension  Elevated LDL cholesterol level -     Lipid panel  Family history of early CAD -     Lipid panel  Screening for diabetes mellitus -     CMP14+EGFR  Medication management -     Lipid panel -     CMP14+EGFR -     TSH + free T4 -     CBC w/Diff/Platelet -     VITAMIN D  25 Hydroxy (Vit-D Deficiency, Fractures)  Nausea -     ondansetron  (ZOFRAN ) 8 MG tablet; Take 1 tablet (8 mg total) by mouth every 8 (eight) hours as needed for nausea or vomiting.  Need for Tdap vaccination -     Tdap vaccine greater than or equal to 7yo IM  History of obesity  Migraine with aura and with status migrainosus, not intractable -     cyclobenzaprine  (FLEXERIL ) 10 MG tablet; Take 1 tablet (10 mg total) by mouth as needed for muscle spasms. -     ondansetron  (ZOFRAN ) 8 MG tablet; Take 1 tablet (8 mg total) by mouth every 8 (eight) hours as needed for nausea or vomiting.   Assessment &  Plan Woman's Wellness Visit Routine wellness visit with discussion on mammogram and tetanus vaccination. - Schedule mammogram. - PAP UTD - Administered tetanus shot.  Primary hypertension Blood pressure readings at home are in the 80s and 90s, which are acceptable. No chest  pain, shortness of breath, or swelling reported. - Rechecked blood pressure.  Migraine without aura Experiences migraines, particularly during weather changes, with three episodes in the last month. Managed with naratriptan  for rescue treatment. - Continue naratriptan  for migraine rescue. - Flexeril  as needed for muscle tightness  Anxiety disorder Currently taking Celexa  and Xanax . Xanax  has not been refilled in a year. - Refilled Celexa . - Refilled Xanax .  Hyperlipidemia Previously on Lipitor, tapered off a few weeks ago. Discussion on zepbound for weight management and cardiovascular benefits. Insurance does not cover GLP-1 for cardiovascular reasons. Cost-effective option found through a clinic. - Continue zepbound for weight management.     Return in about 6 months (around 09/01/2024).     Abdishakur Gottschall, PA-C      [1] No Known Allergies [2]  Outpatient Medications Prior to Visit  Medication Sig   Albuterol -Budesonide (AIRSUPRA ) 90-80 MCG/ACT AERO Inhale 2 puffs into the lungs every 6 (six) hours as needed.   cetirizine (ZYRTEC) 10 MG tablet Take 10 mg by mouth daily.   cholecalciferol (VITAMIN D3) 25 MCG (1000 UT) tablet Take 1,000 Units by mouth daily.   fluticasone  (FLONASE ) 50 MCG/ACT nasal spray Place 2 sprays into the nose as needed. Reported on 07/15/2015   levonorgestrel  (MIRENA ) 20 MCG/DAY IUD 1 each by Intrauterine route once.   Multiple Vitamin (MULTIVITAMIN ADULT PO) Take by mouth.   mupirocin  ointment (BACTROBAN ) 2 % Apply 1 application topically 3 (three) times daily. Apply to affected area for 7-10 days.   tirzepatide (ZEPBOUND) 10 MG/0.5ML injection vial Inject into the skin.   tretinoin (RETIN-A) 0.05 % cream Apply topically at bedtime.   [DISCONTINUED] AMBULATORY NON FORMULARY MEDICATION Semaglutide  2.5mg  with pyridoxine 10mg  per mL Inject 40 units 1.0mg  once weekly   atorvastatin  (LIPITOR) 40 MG tablet Take 1 tablet (40 mg total) by mouth daily.  (Patient not taking: Reported on 03/04/2024)   [DISCONTINUED] ALPRAZolam  (XANAX ) 0.5 MG tablet Take 1/2 tablet (0.25mg ) to 1 tablet (0.5mg ) BY MOUTH up to two times per day as needed for severe anxiety and panic.   [DISCONTINUED] azithromycin  (ZITHROMAX  Z-PAK) 250 MG tablet Take 2 tablets (500 mg) on  Day 1,  followed by 1 tablet (250 mg) once daily on Days 2 through 5.   [DISCONTINUED] citalopram  (CELEXA ) 10 MG tablet Take 1 tablet (10 mg total) by mouth daily.   [DISCONTINUED] cyclobenzaprine  (FLEXERIL ) 10 MG tablet Take 1 tablet (10 mg total) by mouth as needed for muscle spasms.   [DISCONTINUED] hydrochlorothiazide  (HYDRODIURIL ) 25 MG tablet Take 1 tablet (25 mg total) by mouth daily.   [DISCONTINUED] HYDROcodone  bit-homatropine (HYCODAN) 5-1.5 MG/5ML syrup Take 5 mLs by mouth every 8 (eight) hours as needed for cough.   [DISCONTINUED] naratriptan  (AMERGE) 2.5 MG tablet TAKE ONE TABLET BY MOUTH AT ONSET OF HEADACHE. IF RETURNS OR DOES NOT RESOLVE, MAY REPEAT AFTER FOUR HOURS DO NOT EXCEED 2/24 HOURS   [DISCONTINUED] ondansetron  (ZOFRAN ) 8 MG tablet Take 1 tablet (8 mg total) by mouth every 8 (eight) hours as needed for nausea or vomiting.   [DISCONTINUED] predniSONE  (DELTASONE ) 20 MG tablet Take 1 tablet (20 mg total) by mouth daily with breakfast.   No facility-administered medications prior to visit.   "

## 2024-03-05 ENCOUNTER — Ambulatory Visit: Payer: Self-pay | Admitting: Physician Assistant

## 2024-03-05 ENCOUNTER — Encounter: Payer: Self-pay | Admitting: Physician Assistant

## 2024-03-05 LAB — CBC WITH DIFFERENTIAL/PLATELET
Basophils Absolute: 0.1 x10E3/uL (ref 0.0–0.2)
Basos: 1 %
EOS (ABSOLUTE): 0.1 x10E3/uL (ref 0.0–0.4)
Eos: 1 %
Hematocrit: 42.3 % (ref 34.0–46.6)
Hemoglobin: 14.1 g/dL (ref 11.1–15.9)
Immature Grans (Abs): 0 x10E3/uL (ref 0.0–0.1)
Immature Granulocytes: 0 %
Lymphocytes Absolute: 1.6 x10E3/uL (ref 0.7–3.1)
Lymphs: 25 %
MCH: 31.5 pg (ref 26.6–33.0)
MCHC: 33.3 g/dL (ref 31.5–35.7)
MCV: 94 fL (ref 79–97)
Monocytes Absolute: 0.6 x10E3/uL (ref 0.1–0.9)
Monocytes: 10 %
Neutrophils Absolute: 4.1 x10E3/uL (ref 1.4–7.0)
Neutrophils: 62 %
Platelets: 215 x10E3/uL (ref 150–450)
RBC: 4.48 x10E6/uL (ref 3.77–5.28)
RDW: 12.1 % (ref 11.7–15.4)
WBC: 6.4 x10E3/uL (ref 3.4–10.8)

## 2024-03-05 LAB — LIPID PANEL
Chol/HDL Ratio: 2.8 ratio (ref 0.0–4.4)
Cholesterol, Total: 189 mg/dL (ref 100–199)
HDL: 67 mg/dL
LDL Chol Calc (NIH): 107 mg/dL — ABNORMAL HIGH (ref 0–99)
Triglycerides: 80 mg/dL (ref 0–149)
VLDL Cholesterol Cal: 15 mg/dL (ref 5–40)

## 2024-03-05 LAB — VITAMIN D 25 HYDROXY (VIT D DEFICIENCY, FRACTURES): Vit D, 25-Hydroxy: 60.5 ng/mL (ref 30.0–100.0)

## 2024-03-05 LAB — CMP14+EGFR
ALT: 8 IU/L (ref 0–32)
AST: 14 IU/L (ref 0–40)
Albumin: 4.4 g/dL (ref 3.9–4.9)
Alkaline Phosphatase: 56 IU/L (ref 41–116)
BUN/Creatinine Ratio: 20 (ref 9–23)
BUN: 18 mg/dL (ref 6–24)
Bilirubin Total: 0.3 mg/dL (ref 0.0–1.2)
CO2: 23 mmol/L (ref 20–29)
Calcium: 9.9 mg/dL (ref 8.7–10.2)
Chloride: 101 mmol/L (ref 96–106)
Creatinine, Ser: 0.91 mg/dL (ref 0.57–1.00)
Globulin, Total: 2.4 g/dL (ref 1.5–4.5)
Glucose: 82 mg/dL (ref 70–99)
Potassium: 3.3 mmol/L — ABNORMAL LOW (ref 3.5–5.2)
Sodium: 140 mmol/L (ref 134–144)
Total Protein: 6.8 g/dL (ref 6.0–8.5)
eGFR: 80 mL/min/1.73

## 2024-03-05 LAB — TSH+FREE T4
Free T4: 1.35 ng/dL (ref 0.82–1.77)
TSH: 1.99 u[IU]/mL (ref 0.450–4.500)

## 2024-03-05 MED ORDER — ATORVASTATIN CALCIUM 10 MG PO TABS: 10.0000 mg | ORAL_TABLET | Freq: Every day | ORAL | 3 refills | Status: AC

## 2024-03-05 NOTE — Progress Notes (Signed)
 Geraldin,   Hemoglobin and thyroid look good.   Vitamin D  looks great.   Potassium a little low. Likely due to hydrochlorothiazide . Start a potassium supplement OTC or we could send you and recheck in a few weeks.   LDL did double from a year ago.  10 year overall risk is still low.  You know the date on statins for prevention of CV disease. I would suggest at least being on a low dose. What about 10mg  daily?   .The 10-year ASCVD risk score (Arnett DK, et al., 2019) is: 0.6%   Values used to calculate the score:     Age: 44 years     Clinically relevant sex: Female     Is Non-Hispanic African American: No     Diabetic: No     Tobacco smoker: No     Systolic Blood Pressure: 120 mmHg     Is BP treated: Yes     HDL Cholesterol: 67 mg/dL     Total Cholesterol: 189 mg/dL

## 2024-03-06 ENCOUNTER — Ambulatory Visit

## 2024-03-06 DIAGNOSIS — Z1231 Encounter for screening mammogram for malignant neoplasm of breast: Secondary | ICD-10-CM | POA: Diagnosis not present

## 2024-05-27 ENCOUNTER — Other Ambulatory Visit
